# Patient Record
Sex: Male | Born: 1970 | Race: White | Hispanic: No | Marital: Married | Smoking: Never smoker
Health system: Southern US, Community
[De-identification: ages and names within clinical notes are randomized; demographics above are authoritative.]

## PROBLEM LIST (undated history)

## (undated) DIAGNOSIS — G5600 Carpal tunnel syndrome, unspecified upper limb: Secondary | ICD-10-CM

## (undated) DIAGNOSIS — G43909 Migraine, unspecified, not intractable, without status migrainosus: Secondary | ICD-10-CM

## (undated) DIAGNOSIS — I10 Essential (primary) hypertension: Secondary | ICD-10-CM

## (undated) DIAGNOSIS — K219 Gastro-esophageal reflux disease without esophagitis: Secondary | ICD-10-CM

## (undated) HISTORY — DX: Migraine, unspecified, not intractable, without status migrainosus: G43.909

## (undated) HISTORY — PX: NASAL POLYP SURGERY: SHX186

## (undated) HISTORY — DX: Essential (primary) hypertension: I10

## (undated) HISTORY — DX: Carpal tunnel syndrome, unspecified upper limb: G56.00

## (undated) HISTORY — DX: Gastro-esophageal reflux disease without esophagitis: K21.9

---

## 2018-05-11 ENCOUNTER — Encounter: Payer: Self-pay | Admitting: Neurology

## 2018-05-12 ENCOUNTER — Other Ambulatory Visit: Payer: Self-pay | Admitting: *Deleted

## 2018-05-12 DIAGNOSIS — G5603 Carpal tunnel syndrome, bilateral upper limbs: Secondary | ICD-10-CM

## 2018-05-21 ENCOUNTER — Encounter: Payer: Self-pay | Admitting: Neurology

## 2018-05-26 ENCOUNTER — Ambulatory Visit (INDEPENDENT_AMBULATORY_CARE_PROVIDER_SITE_OTHER): Payer: 59 | Admitting: Neurology

## 2018-05-26 DIAGNOSIS — G5603 Carpal tunnel syndrome, bilateral upper limbs: Secondary | ICD-10-CM | POA: Diagnosis not present

## 2018-05-26 NOTE — Procedures (Signed)
[facility]  [address], [address]  [place], [place] [address] Tel: [phone] Fax:  [phone] Test Date:  05/26/2018  Patient: Patrick RheinSteven Fristoe DOB: November 06, 1970 Physician: Nita Sickleonika Lashonne Shull, DO  Sex: Male Height: 5\' 11"  Ref Phys: Cheri RousJohn Slatosky, MD  ID#: [id number] Temp: 34.3C Technician:    Patient Complaints: This is a 47 year-old man referred for evaluation of bilateral lateral hand numbness and tingling, worse in the right.  NCV & EMG Findings: Extensive electrodiagnostic testing of the right upper extremity and additional studies of the left shows:  1. Bilateral median sensory responses show prolonged distal peak latency (R4.2, L4.0 ms) and reduced amplitude (R4.8, L12.3 V). Bilateral ulnar sensory responses are within normal limits. 2. Right median motor response shows prolonged latency (4.1 ms).  Left median and bilateral ulnar motor responses are within normal limits.  3. Chronic motor axon loss changes isolated to the right abductor pollicis brevis muscle, without need active denervation.   Impression: Bilateral median neuropathy at or distal to the wrist, consistent with clinical diagnosis of carpal tunnel syndrome. Overall, these findings are moderate in degree electrically and worse on the right.   ___________________________ Nita Sickleonika Able Malloy, DO    Nerve Conduction Studies Anti Sensory Summary Table   Site NR Peak (ms) Norm Peak (ms) P-T Amp (V) Norm P-T Amp  Left Median Anti Sensory (2nd Digit)  34.3C  Wrist    4.0 <3.4 12.3 >20  Right Median Anti Sensory (2nd Digit)  34.3C  Wrist    4.2 <3.4 4.8 >20  Left Ulnar Anti Sensory (5th Digit)  34.3C  Wrist    2.4 <3.1 29.5 >12  Right Ulnar Anti Sensory (5th Digit)  34.3C  Wrist    2.1 <3.1 35.1 >12   Motor Summary Table   Site NR Onset (ms) Norm Onset (ms) O-P Amp (mV) Norm O-P Amp Site1 Site2 Delta-0 (ms) Dist (cm) Vel (m/s) Norm Vel (m/s)  Left Median Motor (Abd Poll Brev)  34.3C  Wrist    3.5  <3.9 8.4 >6 Elbow Wrist 5.1 31.0 61 >50  Elbow    8.6  7.9         Right Median Motor (Abd Poll Brev)  34.3C  Wrist    4.1 <3.9 7.2 >6 Elbow Wrist 5.0 30.0 60 >50  Elbow    9.1  7.0         Left Ulnar Motor (Abd Dig Minimi)  34.3C  Wrist    2.1 <3.1 10.0 >7 B Elbow Wrist 3.8 25.0 66 >50  B Elbow    5.9  9.8  A Elbow B Elbow 1.7 10.0 59 >50  A Elbow    7.6  9.5         Right Ulnar Motor (Abd Dig Minimi)  34.3C  Wrist    2.0 <3.1 9.0 >7 B Elbow Wrist 3.9 24.0 62 >50  B Elbow    5.9  8.8  A Elbow B Elbow 1.6 10.0 63 >50  A Elbow    7.5  8.6          EMG   Side Muscle Ins Act Fibs Psw Fasc Number Recrt Dur Dur. Amp Amp. Poly Poly. Comment  Right 1stDorInt Nml Nml Nml Nml Nml Nml Nml Nml Nml Nml Nml Nml N/A  Right Abd Poll Brev Nml Nml Nml Nml 1- Rapid Few 1+ Few 1+ Nml Nml N/A  Right Ext Indicis Nml Nml Nml Nml Nml Nml Nml Nml Nml Nml Nml Nml  N/A  Right PronatorTeres Nml Nml Nml Nml Nml Nml Nml Nml Nml Nml Nml Nml N/A  Right Biceps Nml Nml Nml Nml Nml Nml Nml Nml Nml Nml Nml Nml N/A  Right Triceps Nml Nml Nml Nml Nml Nml Nml Nml Nml Nml Nml Nml N/A  Right Deltoid Nml Nml Nml Nml Nml Nml Nml Nml Nml Nml Nml Nml N/A  Left 1stDorInt Nml Nml Nml Nml Nml Nml Nml Nml Nml Nml Nml Nml N/A  Left Abd Poll Brev Nml Nml Nml Nml Nml Nml Nml Nml Nml Nml Nml Nml N/A  Left PronatorTeres Nml Nml Nml Nml Nml Nml Nml Nml Nml Nml Nml Nml N/A  Left Biceps Nml Nml Nml Nml Nml Nml Nml Nml Nml Nml Nml Nml N/A  Left Triceps Nml Nml Nml Nml Nml Nml Nml Nml Nml Nml Nml Nml N/A  Left Deltoid Nml Nml Nml Nml Nml Nml Nml Nml Nml Nml Nml Nml N/A      Waveforms:

## 2018-08-21 ENCOUNTER — Other Ambulatory Visit: Payer: Self-pay | Admitting: *Deleted

## 2018-08-21 ENCOUNTER — Ambulatory Visit: Payer: 59 | Admitting: Neurology

## 2018-08-21 ENCOUNTER — Encounter: Payer: Self-pay | Admitting: *Deleted

## 2018-08-21 ENCOUNTER — Encounter: Payer: Self-pay | Admitting: Neurology

## 2018-08-21 ENCOUNTER — Other Ambulatory Visit: Payer: Self-pay | Admitting: Neurology

## 2018-08-21 VITALS — BP 130/84 | HR 68 | Ht 71.0 in | Wt 256.5 lb

## 2018-08-21 DIAGNOSIS — IMO0002 Reserved for concepts with insufficient information to code with codable children: Secondary | ICD-10-CM

## 2018-08-21 DIAGNOSIS — G43709 Chronic migraine without aura, not intractable, without status migrainosus: Secondary | ICD-10-CM | POA: Diagnosis not present

## 2018-08-21 DIAGNOSIS — G4440 Drug-induced headache, not elsewhere classified, not intractable: Secondary | ICD-10-CM

## 2018-08-21 DIAGNOSIS — M542 Cervicalgia: Secondary | ICD-10-CM | POA: Diagnosis not present

## 2018-08-21 DIAGNOSIS — G5603 Carpal tunnel syndrome, bilateral upper limbs: Secondary | ICD-10-CM | POA: Diagnosis not present

## 2018-08-21 DIAGNOSIS — G43009 Migraine without aura, not intractable, without status migrainosus: Secondary | ICD-10-CM | POA: Insufficient documentation

## 2018-08-21 MED ORDER — ERENUMAB-AOOE 70 MG/ML ~~LOC~~ SOAJ: 70.0000 mg | SUBCUTANEOUS | 11 refills | Status: DC

## 2018-08-21 MED ORDER — TOPIRAMATE 50 MG PO TABS: ORAL_TABLET | ORAL | 5 refills | Status: DC

## 2018-08-21 MED ORDER — CYCLOBENZAPRINE HCL 5 MG PO TABS: 5.0000 mg | ORAL_TABLET | Freq: Every evening | ORAL | 2 refills | Status: DC | PRN

## 2018-08-21 MED ORDER — SUMATRIPTAN SUCCINATE 100 MG PO TABS: ORAL_TABLET | ORAL | 11 refills | Status: DC

## 2018-08-21 NOTE — Patient Instructions (Addendum)
Increase topiramate to 25mg  in the morning and 50mg  at bedtime x 2 weeks, then increase to 50mg  twice daily.  Start flexeril 5mg  at bedtime as needed for neck pain  Start magnesium oxide 400-600mg  daily  Start monthly Aimovig injection   Try to limit all rescue medication (imitrex, Goody's powders) to twice per week  Return to clinic in 4 months

## 2018-08-21 NOTE — Progress Notes (Signed)
[facility] Clinic Note - Initial Visit   Date: 08/21/18  Patrick Christensen MRN: [id number] DOB: 11-25-1970   Dear Dr. Guilford Shi:  Thank you for your kind referral of Patrick Christensen for consultation of migraines and CTS. Although his history is well known to you, please allow [place] to reiterate it for the purpose of our medical record. The patient was accompanied to the clinic by self.   History of Present Illness: Patrick Christensen is a 47 y.o. left-handed Caucasian male with GERD, hypertension, and migraines presenting for evaluation of worsening headaches and bilateral CTS.    He has suffered from migraines since childhood (~age 50) and was treated with medications as needed.  [facility] and was tried on depakote, wellbutrin, topiramate, amitriptyline, verapamil, venlafaxine, maxalt and zomig.  He has not tried botox.    He currently takes imitrex which is effective and topiramate 25mg  twice daily.  His migraines are left sided, usually retroorbital at at the base of the neck.  Pain is dull achy to severe pounding pain.  He has some nausea, vomiting, photophobia, and phonophobia.  He does not have vision changes, numbess/tingling, or weakness. He typically gets anywhere from 8-15 headache-days per month.  Duration is typically < 24 hours.  Rest and imitrex helps. He takes BC powder about 2-3 times per week additional to his imitrex which he takes 2-3 times per week.  His daughter also has migraines.   He also has bilateral hand paresthesias and had NCS/EMG of the arms performed here in July which showed moderate bilateral CTS.  He saw Dr. Amanda Pea and is scheduled for surgery on Tuesday for right CTS release.   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  He is here today after working 48 hour shift.  He does not sleep regularly nor get adequate rest.  He has known history of  OSA and used CPAP for several years, which did not improve his morning headaches, so stopped using it.  He has lost 80lb with diet and exercise.   Out-side paper records, electronic medical record, and images have been reviewed where available and summarized as:  NCS/EMG of the arms 05/26/2018:   Bilateral median neuropathy at or distal to the wrist, consistent with clinical diagnosis of carpal tunnel syndrome. Overall, these findings are moderate in degree electrically and worse on the right.  Past Medical History:  Diagnosis Date  . CTS (carpal tunnel syndrome)   . GERD (gastroesophageal reflux disease)   . Hypertension   . Migraine     Past Surgical History:  Procedure Laterality Date  . NASAL POLYP SURGERY       Medications:  Outpatient Encounter Medications as of 08/21/2018  Medication Sig  . amLODipine (NORVASC) 5 MG tablet   . SUMAtriptan (IMITREX) 100 MG tablet Take 1 tablet at headache onset.  If no improvement in 2 hours, ok to repeat.  . topiramate (TOPAMAX) 50 MG tablet Take 1 tablet twice daily  . [DISCONTINUED] SUMAtriptan (IMITREX) 100 MG tablet Take 100 mg by mouth See admin instructions.  . [DISCONTINUED] topiramate (TOPAMAX) 25 MG tablet   . cyclobenzaprine (FLEXERIL) 5 MG tablet Take 1 tablet (5 mg total) by mouth at bedtime as needed for muscle spasms.  Dorise Hiss (AIMOVIG) 70 MG/ML SOAJ Inject 70 mg into the skin every 30 (thirty) days.  . [DISCONTINUED] phentermine 37.5 MG capsule Take  37.5 mg by mouth every morning.   No facility-administered encounter medications on file as of 08/21/2018.      Allergies: No Known Allergies  Family History: Family History  Problem Relation Age of Onset  . GER disease Mother   . Hypertension Mother   . CAD Father   . GER disease Father   . Hypertension Father   . Hyperlipidemia Father   . Colon cancer Maternal Grandmother   . Lung cancer Maternal Grandfather   . Lung cancer Paternal Grandfather     Social  History: Social History   Tobacco Use  . Smoking status: Never Smoker  . Smokeless tobacco: Never Used  Substance Use Topics  . Alcohol use: Not Currently  . Drug use: Never   Social History   Social History Narrative   Lives with wife and 2 children in a one story home.  Works as a Radiation protection practitioner.  Education: associates degree.      Review of Systems:  CONSTITUTIONAL: No fevers, chills, night sweats, or weight loss.   EYES: No visual changes or eye pain ENT: No hearing changes.  No history of nose bleeds.   RESPIRATORY: No cough, wheezing and shortness of breath.   CARDIOVASCULAR: Negative for chest pain, and palpitations.   GI: Negative for abdominal discomfort, blood in stools or black stools.  No recent change in bowel habits.   GU:  No history of incontinence.   MUSCLOSKELETAL: No history of joint pain or swelling.  No myalgias.   SKIN: Negative for lesions, rash, and itching.   HEMATOLOGY/ONCOLOGY: Negative for prolonged bleeding, bruising easily, and swollen nodes.  No history of cancer.   ENDOCRINE: Negative for cold or heat intolerance, polydipsia or goiter.   PSYCH:  No depression or anxiety symptoms.   NEURO: As Above.   Vital Signs:  BP 130/84   Pulse 68   Ht 5\' 11"  (1.803 m)   Wt 256 lb 8 oz (116.3 kg)   SpO2 98%   BMI 35.77 kg/m    General Medical Exam:   General:  Well appearing, comfortable.   Eyes/ENT: see cranial nerve examination.   Neck: No masses appreciated.  Full range of motion without tenderness.  No carotid bruits. Respiratory:  Clear to auscultation, good air entry bilaterally.   Cardiac:  Regular rate and rhythm, no murmur.   Extremities:  No deformities, edema, or skin discoloration.  Skin:  No rashes or lesions.  Neurological Exam: MENTAL STATUS including orientation to time, place, person, recent and remote memory, attention span and concentration, language, and fund of knowledge is normal.  Speech is not dysarthric.  CRANIAL NERVES: II:   No visual field defects.  Unremarkable fundi.   III-IV-VI: Pupils equal round and reactive to light.  Normal conjugate, extra-ocular eye movements in all directions of gaze.  No nystagmus.  No ptosis.   V:  Normal facial sensation.    VII:  Normal facial symmetry and movements.  VIII:  Normal hearing and vestibular function.   IX-X:  Normal palatal movement.   XI:  Normal shoulder shrug and head rotation.   XII:  Normal tongue strength and range of motion, no deviation or fasciculation.  MOTOR:  Motor strength is 5/5 throughout.  No atrophy, fasciculations or abnormal movements.  No pronator drift.  Tone is normal.    MSRs:  Right  Left brachioradialis 2+  brachioradialis 2+  biceps 2+  biceps 2+  triceps 2+  triceps 2+  patellar 2+  patellar 2+  ankle jerk 2+  ankle jerk 2+  Hoffman no  Hoffman no  plantar response down  plantar response down   SENSORY:  Normal and symmetric perception of light touch, vibration, and proprioception.    COORDINATION/GAIT: Normal finger-to- nose-finger.  Intact rapid alternating movements bilaterally.  Tandem and stressed gait intact.    IMPRESSION: 1.  Chronic migraine  - Lifestyle modification was stressed as he is getting very little sleep, working 80-120hr/week) and not using CPAP with OSA   - Previously tried: depakote, wellbutrin, topiramate, amitriptyline, verapamil, venlafaxine  - Increase topiramate to 50mg  twice daily  - Start PA for Aimovig 70mg  monthly - first injection given today  - Start magnesium 400-600mg  daily  2.  Episodic migraine without aura  - Previously tried:  Maxalt, zomig  - Continue imitrex 100mg  for severe migraine, try to limit to twice per week 3.  Medication overuse headaches  - Counseled at length about rebound headaches due to too much rescue medication  - Limit all rescue therapy to twice per week 4.  Cervicalgia  - Start flexeril 5mg  at bedtime as  needed 5.  Bilateral CTS, worse on the right, followed by Dr. Amanda Pea with upcoming surgery next week  Return to clinic in 4 months.  Thank you for allowing me to participate in patient's care.  If I can answer any additional questions, I would be pleased to do so.    Sincerely,    Ailana Cuadrado K. Allena Katz, DO

## 2019-01-07 NOTE — Progress Notes (Signed)
Follow-up Visit   Date: 01/08/19   OLUKAYODE NUZUM MRN: [id number] DOB: 09/24/1971   Interim History: Patrick Christensen is a 48 y.o. left-handed Caucasian male with GERD, hypertension, and migraines returning to the clinic for follow-up of headaches.  The patient was accompanied to the clinic by self.  History of present illness: He has suffered from migraines since childhood (~age 48) and was treated with medications as needed.  [facility] and was tried on depakote, wellbutrin, topiramate, amitriptyline, verapamil, venlafaxine, maxalt and zomig.  He has not tried botox.    He takes imitrex which is effective and topiramate 25mg  twice daily.  His migraines are left sided, usually retroorbital at at the base of the neck.  Pain is dull achy to severe pounding pain.  He has some nausea, vomiting, photophobia, and phonophobia.  He does not have vision changes, numbess/tingling, or weakness. He typically gets anywhere from 8-15 headache-days per month.  Duration is typically < 24 hours.  Rest and imitrex helps. He takes BC powder about 2-3 times per week additional to his imitrex which he takes 2-3 times per week.  His daughter also has migraines.   He also has bilateral hand paresthesias and had NCS/EMG of the arms performed here in July which showed moderate bilateral CTS.    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  He is here today after working 48 hour shift.  He does not sleep regularly nor get adequate rest.  He has known history of OSA and used CPAP for several years, which did not improve his morning headaches, so stopped using it.  He has lost 80lb with diet and exercise.   UPDATE 01/08/2019:  He continues to take topiramate 25mg  and Aimovig monthly injections. He is concerned about the cost of Aimovig once the promotional year is up.   He has noticed mild improvement, but continues to have  headaches about 8-10 times per month, which continue to respond to imitrex and NSAIDs.  He had right CTS release and is doing well.   Medications:  Current Outpatient Medications on File Prior to Visit  Medication Sig Dispense Refill  . AIMOVIG 70 MG/ML SOAJ INJECT 70 MG INTO THE SKIN EVERY 30 (THIRTY) DAYS. 1 pen 11  . amLODipine (NORVASC) 5 MG tablet     . cyclobenzaprine (FLEXERIL) 5 MG tablet Take 1 tablet (5 mg total) by mouth at bedtime as needed for muscle spasms. 30 tablet 2  . SUMAtriptan (IMITREX) 100 MG tablet Take 1 tablet at headache onset.  If no improvement in 2 hours, ok to repeat. 12 tablet 11  . topiramate (TOPAMAX) 50 MG tablet Take 1 tablet twice daily 60 tablet 5   No current facility-administered medications on file prior to visit.     Allergies: No Known Allergies  Review of Systems:  CONSTITUTIONAL: No fevers, chills, night sweats, or weight loss.  EYES: No visual changes or eye pain ENT: No hearing changes.  No history of nose bleeds.   RESPIRATORY: No cough, wheezing and shortness of breath.   CARDIOVASCULAR: Negative for chest pain, and palpitations.   GI: Negative for abdominal discomfort, blood in stools or black stools.  No recent change in bowel habits.   GU:  No history of incontinence.   MUSCLOSKELETAL: No history of joint pain or swelling.  No myalgias.   SKIN: Negative for lesions, rash, and  itching.   ENDOCRINE: Negative for cold or heat intolerance, polydipsia or goiter.   PSYCH:  No depression or anxiety symptoms.   NEURO: As Above.   Vital Signs:  BP 118/88   Pulse 78   Temp 97.6 F (36.4 C)   Ht 5\' 11"  (1.803 m)   Wt 300 lb (136.1 kg)   SpO2 98%   BMI 41.84 kg/m    General Medical Exam:   General:  Well appearing, comfortable  Eyes/ENT: see cranial nerve examination.   Neck:  No carotid bruits. Respiratory:  Clear to auscultation, good air entry bilaterally.   Cardiac:  Regular rate and rhythm, no murmur.   Ext:  No edema    Neurological Exam: MENTAL STATUS including orientation to time, place, person, recent and remote memory, attention span and concentration, language, and fund of knowledge is normal.  Speech is not dysarthric.  CRANIAL NERVES:  No visual field defects.  Pupils equal round and reactive to light.  Normal conjugate, extra-ocular eye movements in all directions of gaze.  No ptosis.  Face is symmetric. Palate elevates symmetrically.  Tongue is midline.  MOTOR:  Motor strength is 5/5 in all extremities  COORDINATION/GAIT:  Normal finger-to- nose-finger.  Intact rapid alternating movements bilaterally.  Gait narrow based and stable.   Data:n/a  IMPRESSION/PLAN: 1.  Chronic migraine, partly contributed by poor sleep hygeinne and and not using CPAP with OSA              - Previously tried: depakote, wellbutrin, topiramate, amitriptyline, verapamil, venlafaxine              - Increase topiramate to 50mg  twice daily              - Continue Aimovig 70mg  monthly   2.  Episodic migraine without aura              - Previously tried:  Maxalt, zomig              - He gets adequate relief with imitrex 100mg  for severe migraine, try to limit to twice per week  3.  Medication overuse headaches, imitrex 8-10 times per month in addition to NSAIDs              - Counseled at length about rebound headaches due to too much rescue medication              - Limit all rescue therapy to twice per week  4.  Cervicalgia              - Continue flexeril 5mg  at bedtime as needed  5.  Bilateral CTS, worse on the right, s/p right CTS release  Return to clinic in 6 months.    Thank you for allowing me to participate in patient's care.  If I can answer any additional questions, I would be pleased to do so.    Sincerely,    Donika K. Allena Katz, DO

## 2019-01-08 ENCOUNTER — Ambulatory Visit (INDEPENDENT_AMBULATORY_CARE_PROVIDER_SITE_OTHER): Payer: 59 | Admitting: Neurology

## 2019-01-08 ENCOUNTER — Other Ambulatory Visit: Payer: Self-pay

## 2019-01-08 ENCOUNTER — Encounter: Payer: Self-pay | Admitting: Neurology

## 2019-01-08 VITALS — BP 118/88 | HR 78 | Temp 97.6°F | Ht 71.0 in | Wt 300.0 lb

## 2019-01-08 DIAGNOSIS — G5603 Carpal tunnel syndrome, bilateral upper limbs: Secondary | ICD-10-CM | POA: Diagnosis not present

## 2019-01-08 DIAGNOSIS — G4440 Drug-induced headache, not elsewhere classified, not intractable: Secondary | ICD-10-CM

## 2019-01-08 DIAGNOSIS — G43709 Chronic migraine without aura, not intractable, without status migrainosus: Secondary | ICD-10-CM | POA: Diagnosis not present

## 2019-01-08 DIAGNOSIS — M542 Cervicalgia: Secondary | ICD-10-CM | POA: Diagnosis not present

## 2019-01-08 DIAGNOSIS — IMO0002 Reserved for concepts with insufficient information to code with codable children: Secondary | ICD-10-CM

## 2019-01-08 MED ORDER — TOPIRAMATE 50 MG PO TABS: 50.0000 mg | ORAL_TABLET | Freq: Two times a day (BID) | ORAL | 3 refills | Status: DC

## 2019-01-08 NOTE — Patient Instructions (Signed)
Increase topiramate 50mg  daily  Continue monthly Aimovig injections  Return to clinic in 6 months

## 2019-06-21 ENCOUNTER — Ambulatory Visit (INDEPENDENT_AMBULATORY_CARE_PROVIDER_SITE_OTHER): Payer: 59 | Admitting: Cardiology

## 2019-06-21 ENCOUNTER — Other Ambulatory Visit: Payer: Self-pay

## 2019-06-21 ENCOUNTER — Encounter: Payer: Self-pay | Admitting: Cardiology

## 2019-06-21 DIAGNOSIS — R0609 Other forms of dyspnea: Secondary | ICD-10-CM

## 2019-06-21 DIAGNOSIS — G4733 Obstructive sleep apnea (adult) (pediatric): Secondary | ICD-10-CM | POA: Diagnosis not present

## 2019-06-21 DIAGNOSIS — R072 Precordial pain: Secondary | ICD-10-CM | POA: Insufficient documentation

## 2019-06-21 MED ORDER — NITROGLYCERIN 0.4 MG SL SUBL: 0.4000 mg | SUBLINGUAL_TABLET | SUBLINGUAL | 11 refills | Status: DC | PRN

## 2019-06-21 MED ORDER — METOPROLOL SUCCINATE ER 25 MG PO TB24: 25.0000 mg | ORAL_TABLET | Freq: Every day | ORAL | 1 refills | Status: DC

## 2019-06-21 NOTE — Progress Notes (Signed)
Cardiology Consultation:    Date:  06/21/2019   ID:  Patrick RompSteven D Peak, DOB 13-Nov-1970, MRN [id number]  PCP:  Nonnie DoneSlatosky, John J., MD  Cardiologist:  Gypsy Balsamobert Laramie Meissner, MD   Referring MD: Nonnie DoneSlatosky, John J., MD   Chief Complaint  Patient presents with  . Shortness of Breath  . Chest Pain    History of Present Illness:    Patrick Christensen is a 48 y.o. male who is being seen today for the evaluation of chest pain and shortness of breath at the request of Slatosky, Excell SeltzerJohn J., MD.  He is an Special educational needs teacherMS technician for last few months he noticed gradual increased shortness of breath with exertion as well as some chest pain.  He is morbidly obese and he struggled with weight all his life multiple times he was able to lose 100 pounds and then gained it all back.  Recently she is gaining weight as well.  She is thinks that maybe he shortness of breath and chest pain is related to weight gain as well as deconditioning or may be simply getting all.  Chest pain he described as tightness that happens sometimes when he walks.  Typically relieved by rest. He does not have the money risk factors for coronary artery disease.  He does not smoke does not have dyslipidemia he said, no diabetes.  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  Past Medical History:  Diagnosis Date  . CTS (carpal tunnel syndrome)   . GERD (gastroesophageal reflux disease)   . Hypertension   . Migraine     Past Surgical History:  Procedure Laterality Date  . CARPAL TUNNEL RELEASE    . MANDIBLE SURGERY    . NASAL POLYP SURGERY    . SKIN GRAFT SPLIT THICKNESS LEG / FOOT      Current Medications: Current Meds  Medication Sig  . AIMOVIG 70 MG/ML SOAJ INJECT 70 MG INTO THE SKIN EVERY 30 (THIRTY) DAYS.  Marland Kitchen. amLODipine (NORVASC) 5 MG tablet Take 5 mg by mouth daily.   . cyclobenzaprine (FLEXERIL)  5 MG tablet Take 1 tablet (5 mg total) by mouth at bedtime as needed for muscle spasms.  . SUMAtriptan (IMITREX) 100 MG tablet Take 1 tablet at headache onset.  If no improvement in 2 hours, ok to repeat.  . topiramate (TOPAMAX) 50 MG tablet Take 1 tablet (50 mg total) by mouth 2 (two) times daily.     Allergies:   Patient has no known allergies.   Social History   Socioeconomic History  . Marital status: Married    Spouse name: Not on file  . Number of children: 2  . Years of education: 4314  . Highest education level: Associate degree: occupational, Scientist, product/process developmenttechnical, or vocational program  Occupational History  . Occupation: paramedic    [place] [employer]  Social Needs  . Financial resource strain: Not on file  . Food insecurity    Worry: Not on file    Inability: Not on file  . Transportation needs    Medical: Not on file    Non-medical: Not on file  Tobacco Use  . Smoking status: Never Smoker  . Smokeless tobacco: Never Used  Substance and Sexual Activity  . Alcohol use: Yes    Comment: occasional  . Drug use: Never  . Sexual activity: Not on file  Lifestyle  .  Physical activity    Days per week: Not on file    Minutes per session: Not on file  . Stress: Not on file  Relationships  . Social Musicianconnections    Talks on phone: Not on file    Gets together: Not on file    Attends religious service: Not on file    Active member of club or organization: Not on file    Attends meetings of clubs or organizations: Not on file    Relationship status: Not on file  Other Topics Concern  . Not on file  Social History Narrative   Lives with wife and 2 children in a one story home.  Works as a Radiation protection practitionerparamedic.  Education: associates degree.       Family History: The patient's family history includes CAD in his father; Colon cancer in his maternal grandmother; GER disease in his father and mother; Hyperlipidemia in his father; Hypertension in his father and mother; Lung cancer in his  maternal grandfather and paternal grandfather. ROS:   Please see the history of present illness.    All 14 point review of systems negative except as described per history of present illness.  EKGs/Labs/Other Studies Reviewed:    The following studies were reviewed today:   EKG:  EKG is  ordered today.  The ekg ordered today demonstrates sinus tachycardia rate 114, normal P interval, normal QS complex duration morphology no ST-T segment changes  Recent Labs: No results found for requested labs within last 8760 hours.  Recent Lipid Panel No results found for: CHOL, TRIG, HDL, CHOLHDL, VLDL, LDLCALC, LDLDIRECT  Physical Exam:    VS:  BP 120/64   Pulse (!) 115   Ht 5\' 11"  (1.803 m)   Wt (!) 305 lb 6.4 oz (138.5 kg)   SpO2 97%   BMI 42.59 kg/m     Wt Readings from Last 3 Encounters:  06/21/19 (!) 305 lb 6.4 oz (138.5 kg)  01/08/19 300 lb (136.1 kg)  08/21/18 256 lb 8 oz (116.3 kg)     GEN:  Well nourished, well developed in no acute distress HEENT: Normal NECK: No JVD; No carotid bruits LYMPHATICS: No lymphadenopathy CARDIAC: RRR, no murmurs, no rubs, no gallops RESPIRATORY:  Clear to auscultation without rales, wheezing or rhonchi  ABDOMEN: Soft, non-tender, non-distended MUSCULOSKELETAL:  No edema; No deformity  SKIN: Warm and dry NEUROLOGIC:  Alert and oriented x 3 PSYCHIATRIC:  Normal affect   ASSESSMENT:    1. Dyspnea on exertion   2. Precordial chest pain   3. Obstructive sleep apnea   4. Morbid obesity (HCC)    PLAN:    In order of problems listed above:  1. Dyspnea on exertion.  Probably multifactorial obesity clearly, plays some role here.  I will ask him to have echocardiogram.  Since he does have a sleep apnea that is not well managed medically where he may have pulmonary hypertension.  He also reports to have some swelling of lower extremities sometimes with could be 1 of the signs and symptoms of pulmonary hypertension.  I will not start any medication  for this problem right now until we have more clarification of the diagnosis. 2. Precordial chest pain with some worrisome characteristics.  Luckily he does not have too many risk factors for coronary artery disease but his symptoms are concerning we will talk about options for this situation.  For now I asked him to start taking one baby aspirin every single day, will put him  on beta-blocker which will be Toprol-XL 25 mg daily, I gave him nitroglycerin as needed.  We will schedule him to have a calcium score in flexion and flow reserve. 3. Obstructive sleep apnea.  We will check him to see if he got any significant pulmonary hypertension.  We started talking about potential referral to sleep clinic he want to think about it. 4. Morbid obesity obviously a problem.  He understands and initially he is trying to work on it.  So far he succeeded few times in his life being able to lose up to 100 pounds.  I hope he will be able to succeed again.   Medication Adjustments/Labs and Tests Ordered: Current medicines are reviewed at length with the patient today.  Concerns regarding medicines are outlined above.  No orders of the defined types were placed in this encounter.  No orders of the defined types were placed in this encounter.   Signed, Park Liter, MD, [facility]. 06/21/2019 3:30 PM    [place]

## 2019-06-21 NOTE — Patient Instructions (Addendum)
Medication Instructions:  Your physician has recommended you make the following change in your medication:   Start: Metoprolol succinate 25 mg daily   Take as needed for chest pain: Nitroglycerin 0.4 mg sublingual (under your tongue) as needed for chest pain. If experiencing chest pain, stop what you are doing and sit down. Take 1 nitroglycerin and wait 5 minutes. If chest pain continues, take another nitroglycerin and wait 5 minutes. If chest pain does not subside, take 1 more nitroglycerin and dial 911. You make take a total of 3 nitroglycerin in a 15 minute time frame.    If you need a refill on your cardiac medications before your next appointment, please call your pharmacy.   Lab work: None.  If you have labs (blood work) drawn today and your tests are completely normal, you will receive your results only by:  MyChart Message (if you have MyChart) OR  A paper copy in the mail If you have any lab test that is abnormal or we need to change your treatment, we will call you to review the results.  Testing/Procedures: Your physician has requested that you have an echocardiogram. Echocardiography is a painless test that uses sound waves to create images of your heart. It provides your doctor with information about the size and shape of your heart and how well your hearts chambers and valves are working. This procedure takes approximately one hour. There are no restrictions for this procedure.  Non-Cardiac CT scanning, (CAT scanning), is a noninvasive, special x-ray that produces cross-sectional images of the body using x-rays and a computer. CT scans help physicians diagnose and treat medical conditions. For some CT exams, a contrast material is used to enhance visibility in the area of the body being studied. CT scans provide greater clarity and reveal more details than regular x-ray exams.    Follow-Up: At [facility], you and your health needs are our priority.  As part of our  continuing mission to provide you with exceptional heart care, we have created designated Provider Care Teams.  These Care Teams include your primary Cardiologist (physician) and Advanced Practice Providers (APPs -  Physician Assistants and Nurse Practitioners) who all work together to provide you with the care you need, when you need it. You will need a follow up appointment in 1 months.  Please call our office 2 months in advance to schedule this appointment.  You may see No primary care provider on file. or another member of our [facility] Provider Team in [place]: Norman HerrlichBrian Munley, MD  Belva Cromeajan Revankar, MD  Any Other Special Instructions Will Be Listed Below (If Applicable).   Echocardiogram An echocardiogram is a procedure that uses painless sound waves (ultrasound) to produce an image of the heart. Images from an echocardiogram can provide important information about:  Signs of coronary artery disease (CAD).  Aneurysm detection. An aneurysm is a weak or damaged part of an artery wall that bulges out from the normal force of blood pumping through the body.  Heart size and shape. Changes in the size or shape of the heart can be associated with certain conditions, including heart failure, aneurysm, and CAD.  Heart muscle function.  Heart valve function.  Signs of a past heart attack.  Fluid buildup around the heart.  Thickening of the heart muscle.  A tumor or infectious growth around the heart valves. Tell a health care provider about:  Any allergies you have.  All medicines you are taking, including vitamins, herbs, eye  drops, creams, and over-the-counter medicines.  Any blood disorders you have.  Any surgeries you have had.  Any medical conditions you have.  Whether you are pregnant or may be pregnant. What are the risks? Generally, this is a safe procedure. However, problems may occur, including:  Allergic reaction to dye (contrast) that may be used during the  procedure. What happens before the procedure? No specific preparation is needed. You may eat and drink normally. What happens during the procedure?   An IV tube may be inserted into one of your veins.  You may receive contrast through this tube. A contrast is an injection that improves the quality of the pictures from your heart.  A gel will be applied to your chest.  A wand-like tool (transducer) will be moved over your chest. The gel will help to transmit the sound waves from the transducer.  The sound waves will harmlessly bounce off of your heart to allow the heart images to be captured in real-time motion. The images will be recorded on a computer. The procedure may vary among health care providers and hospitals. What happens after the procedure?  You may return to your normal, everyday life, including diet, activities, and medicines, unless your health care provider tells you not to do that. Summary  An echocardiogram is a procedure that uses painless sound waves (ultrasound) to produce an image of the heart.  Images from an echocardiogram can provide important information about the size and shape of your heart, heart muscle function, heart valve function, and fluid buildup around your heart.  You do not need to do anything to prepare before this procedure. You may eat and drink normally.  After the echocardiogram is completed, you may return to your normal, everyday life, unless your health care provider tells you not to do that. This information is not intended to replace advice given to you by your health care provider. Make sure you discuss any questions you have with your health care provider. Document Released: 10/11/2000 Document Revised: 02/04/2019 Document Reviewed: 11/16/2016 Elsevier Patient Education  2020 Elsevier Inc.   Metoprolol extended-release tablets What is this medicine? METOPROLOL (me TOE proe lole) is a beta-blocker. Beta-blockers reduce the workload  on the heart and help it to beat more regularly. This medicine is used to treat high blood pressure and to prevent chest pain. It is also used to after a heart attack and to prevent an additional heart attack from occurring. This medicine may be used for other purposes; ask your health care provider or pharmacist if you have questions. COMMON BRAND NAME(S): toprol, Toprol XL What should I tell my health care provider before I take this medicine? They need to know if you have any of these conditions:  diabetes  heart or vessel disease like slow heart rate, worsening heart failure, heart block, sick sinus syndrome or Raynaud's disease  kidney disease  liver disease  lung or breathing disease, like asthma or emphysema  pheochromocytoma  thyroid disease  an unusual or allergic reaction to metoprolol, other beta-blockers, medicines, foods, dyes, or preservatives  pregnant or trying to get pregnant  breast-feeding How should I use this medicine? Take this medicine by mouth with a glass of water. Follow the directions on the prescription label. Do not crush or chew. Take this medicine with or immediately after meals. Take your doses at regular intervals. Do not take more medicine than directed. Do not stop taking this medicine suddenly. This could lead to serious heart-related effects.  Talk to your pediatrician regarding the use of this medicine in children. While this drug may be prescribed for children as young as 6 years for selected conditions, precautions do apply. Overdosage: If you think you have taken too much of this medicine contact a poison control center or emergency room at once. NOTE: This medicine is only for you. Do not share this medicine with others. What if I miss a dose? If you miss a dose, take it as soon as you can. If it is almost time for your next dose, take only that dose. Do not take double or extra doses. What may interact with this medicine? This medicine may  interact with the following medications:  certain medicines for blood pressure, heart disease, irregular heart beat  certain medicines for depression, like monoamine oxidase (MAO) inhibitors, fluoxetine, or paroxetine  clonidine  dobutamine  epinephrine  isoproterenol  reserpine This list may not describe all possible interactions. Give your health care provider a list of all the medicines, herbs, non-prescription drugs, or dietary supplements you use. Also tell them if you smoke, drink alcohol, or use illegal drugs. Some items may interact with your medicine. What should I watch for while using this medicine? Visit your doctor or health care professional for regular check ups. Contact your doctor right away if your symptoms worsen. Check your blood pressure and pulse rate regularly. Ask your health care professional what your blood pressure and pulse rate should be, and when you should contact them. You may get drowsy or dizzy. Do not drive, use machinery, or do anything that needs mental alertness until you know how this medicine affects you. Do not sit or stand up quickly, especially if you are an older patient. This reduces the risk of dizzy or fainting spells. Contact your doctor if these symptoms continue. Alcohol may interfere with the effect of this medicine. Avoid alcoholic drinks. This medicine may increase blood sugar. Ask your healthcare provider if changes in diet or medicines are needed if you have diabetes. What side effects may I notice from receiving this medicine? Side effects that you should report to your doctor or health care professional as soon as possible:  allergic reactions like skin rash, itching or hives  cold or numb hands or feet  depression  difficulty breathing  faint  fever with sore throat  irregular heartbeat, chest pain  rapid weight gain   signs and symptoms of high blood sugar such as being more thirsty or hungry or having to urinate more  than normal. You may also feel very tired or have blurry vision.  swollen legs or ankles Side effects that usually do not require medical attention (report to your doctor or health care professional if they continue or are bothersome):  anxiety or nervousness  change in sex drive or performance  dry skin  headache  nightmares or trouble sleeping  short term memory loss  stomach upset or diarrhea This list may not describe all possible side effects. Call your doctor for medical advice about side effects. You may report side effects to FDA at 1-800-FDA-1088. Where should I keep my medicine? Keep out of the reach of children. Store at room temperature between 15 and 30 degrees C (59 and 86 degrees F). Throw away any unused medicine after the expiration date. NOTE: This sheet is a summary. It may not cover all possible information. If you have questions about this medicine, talk to your doctor, pharmacist, or health care provider.  2020 Elsevier/Gold  Standard (2018-08-04 11:09:41)   Coronary Calcium Scan A coronary calcium scan is an imaging test used to look for deposits of calcium and other fatty materials (plaques) in the inner lining of the blood vessels of the heart (coronary arteries). These deposits of calcium and plaques can partly clog and narrow the coronary arteries without producing any symptoms or warning signs. This puts a person at risk for a heart attack. This test can detect these deposits before symptoms develop. Tell a health care provider about:  Any allergies you have.  All medicines you are taking, including vitamins, herbs, eye drops, creams, and over-the-counter medicines.  Any problems you or family members have had with anesthetic medicines.  Any blood disorders you have.  Any surgeries you have had.  Any medical conditions you have.  Whether you are pregnant or may be pregnant. What are the risks? Generally, this is a safe procedure. However,  problems may occur, including:  Harm to a pregnant woman and her unborn baby. This test involves the use of radiation. Radiation exposure can be dangerous to a pregnant woman and her unborn baby. If you are pregnant, you generally should not have this procedure done.  Slight increase in the risk of cancer. This is because of the radiation involved in the test. What happens before the procedure? No preparation is needed for this procedure. What happens during the procedure?   You will undress and remove any jewelry around your neck or chest.  You will put on a hospital gown.  Sticky electrodes will be placed on your chest. The electrodes will be connected to an electrocardiogram (ECG) machine to record a tracing of the electrical activity of your heart.  A CT scanner will take pictures of your heart. During this time, you will be asked to lie still and hold your breath for 2-3 seconds while a picture of your heart is being taken. The procedure may vary among health care providers and hospitals. What happens after the procedure?  You can get dressed.  You can return to your normal activities.  It is up to you to get the results of your test. Ask your health care provider, or the department that is doing the test, when your results will be ready. Summary  A coronary calcium scan is an imaging test used to look for deposits of calcium and other fatty materials (plaques) in the inner lining of the blood vessels of the heart (coronary arteries).  Generally, this is a safe procedure. Tell your health care provider if you are pregnant or may be pregnant.  No preparation is needed for this procedure.  A CT scanner will take pictures of your heart.  You can return to your normal activities after the scan is done. This information is not intended to replace advice given to you by your health care provider. Make sure you discuss any questions you have with your health care provider. Document  Released: 04/11/2008 Document Revised: 09/26/2017 Document Reviewed: 09/02/2016 Elsevier Patient Education  2020 Elsevier Inc.  Nitroglycerin sublingual tablets What is this medicine? NITROGLYCERIN (nye troe GLI ser in) is a type of vasodilator. It relaxes blood vessels, increasing the blood and oxygen supply to your heart. This medicine is used to relieve chest pain caused by angina. It is also used to prevent chest pain before activities like climbing stairs, going outdoors in cold weather, or sexual activity. This medicine may be used for other purposes; ask your health care provider or pharmacist if you  have questions. COMMON BRAND NAME(S): Nitroquick, Nitrostat, Nitrotab What should I tell my health care provider before I take this medicine? They need to know if you have any of these conditions:  anemia  head injury, recent stroke, or bleeding in the brain  liver disease  previous heart attack  an unusual or allergic reaction to nitroglycerin, other medicines, foods, dyes, or preservatives  pregnant or trying to get pregnant  breast-feeding How should I use this medicine? Take this medicine by mouth as needed. At the first sign of an angina attack (chest pain or tightness) place one tablet under your tongue. You can also take this medicine 5 to 10 minutes before an event likely to produce chest pain. Follow the directions on the prescription label. Let the tablet dissolve under the tongue. Do not swallow whole. Replace the dose if you accidentally swallow it. It will help if your mouth is not dry. Saliva around the tablet will help it to dissolve more quickly. Do not eat or drink, smoke or chew tobacco while a tablet is dissolving. If you are not better within 5 minutes after taking ONE dose of nitroglycerin, call 9-1-1 immediately to seek emergency medical care. Do not take more than 3 nitroglycerin tablets over 15 minutes. If you take this medicine often to relieve symptoms of  angina, your doctor or health care professional may provide you with different instructions to manage your symptoms. If symptoms do not go away after following these instructions, it is important to call 9-1-1 immediately. Do not take more than 3 nitroglycerin tablets over 15 minutes. Talk to your pediatrician regarding the use of this medicine in children. Special care may be needed. Overdosage: If you think you have taken too much of this medicine contact a poison control center or emergency room at once. NOTE: This medicine is only for you. Do not share this medicine with others. What if I miss a dose? This does not apply. This medicine is only used as needed. What may interact with this medicine? Do not take this medicine with any of the following medications:  certain migraine medicines like ergotamine and dihydroergotamine (DHE)  medicines used to treat erectile dysfunction like sildenafil, tadalafil, and vardenafil  riociguat This medicine may also interact with the following medications:  alteplase  aspirin  heparin  medicines for high blood pressure  medicines for mental depression  other medicines used to treat angina  phenothiazines like chlorpromazine, mesoridazine, prochlorperazine, thioridazine This list may not describe all possible interactions. Give your health care provider a list of all the medicines, herbs, non-prescription drugs, or dietary supplements you use. Also tell them if you smoke, drink alcohol, or use illegal drugs. Some items may interact with your medicine. What should I watch for while using this medicine? Tell your doctor or health care professional if you feel your medicine is no longer working. Keep this medicine with you at all times. Sit or lie down when you take your medicine to prevent falling if you feel dizzy or faint after using it. Try to remain calm. This will help you to feel better faster. If you feel dizzy, take several deep breaths and  lie down with your feet propped up, or bend forward with your head resting between your knees. You may get drowsy or dizzy. Do not drive, use machinery, or do anything that needs mental alertness until you know how this drug affects you. Do not stand or sit up quickly, especially if you are an older  patient. This reduces the risk of dizzy or fainting spells. Alcohol can make you more drowsy and dizzy. Avoid alcoholic drinks. Do not treat yourself for coughs, colds, or pain while you are taking this medicine without asking your doctor or health care professional for advice. Some ingredients may increase your blood pressure. What side effects may I notice from receiving this medicine? Side effects that you should report to your doctor or health care professional as soon as possible:  blurred vision  dry mouth  skin rash  sweating  the feeling of extreme pressure in the head  unusually weak or tired Side effects that usually do not require medical attention (report to your doctor or health care professional if they continue or are bothersome):  flushing of the face or neck  headache  irregular heartbeat, palpitations  nausea, vomiting This list may not describe all possible side effects. Call your doctor for medical advice about side effects. You may report side effects to FDA at 1-800-FDA-1088. Where should I keep my medicine? Keep out of the reach of children. Store at room temperature between 20 and 25 degrees C (68 and 77 degrees F). Store in [facility] [employer]. Protect from light and moisture. Keep tightly closed. Throw away any unused medicine after the expiration date. NOTE: This sheet is a summary. It may not cover all possible information. If you have questions about this medicine, talk to your doctor, pharmacist, or health care provider.  2020 Elsevier/Gold Standard (2013-08-12 17:57:36)

## 2019-06-21 NOTE — Addendum Note (Signed)
Addended by: Ashok Norris on: 06/21/2019 03:43 PM   Modules accepted: Orders

## 2019-06-24 ENCOUNTER — Ambulatory Visit (HOSPITAL_BASED_OUTPATIENT_CLINIC_OR_DEPARTMENT_OTHER)
Admission: RE | Admit: 2019-06-24 | Discharge: 2019-06-24 | Disposition: A | Discharge status: Home / Self Care | Payer: Managed Care, Other (non HMO) | Source: Ambulatory Visit | Attending: Cardiology | Admitting: Cardiology

## 2019-06-24 ENCOUNTER — Other Ambulatory Visit: Payer: Self-pay

## 2019-06-24 DIAGNOSIS — R072 Precordial pain: Secondary | ICD-10-CM | POA: Insufficient documentation

## 2019-06-24 DIAGNOSIS — R0609 Other forms of dyspnea: Secondary | ICD-10-CM | POA: Diagnosis not present

## 2019-06-24 NOTE — Progress Notes (Signed)
  Echocardiogram 2D Echocardiogram has been performed.  Cardell Peach 06/24/2019, 8:59 AM

## 2019-07-02 ENCOUNTER — Ambulatory Visit: Payer: 59 | Admitting: Cardiology

## 2019-07-16 ENCOUNTER — Ambulatory Visit: Payer: 59 | Admitting: Cardiology

## 2019-07-20 ENCOUNTER — Encounter: Payer: Self-pay | Admitting: Neurology

## 2019-07-21 ENCOUNTER — Other Ambulatory Visit: Payer: Self-pay

## 2019-07-21 ENCOUNTER — Telehealth (INDEPENDENT_AMBULATORY_CARE_PROVIDER_SITE_OTHER): Payer: Managed Care, Other (non HMO) | Admitting: Neurology

## 2019-07-21 VITALS — Ht 71.0 in | Wt 300.0 lb

## 2019-07-21 DIAGNOSIS — G43709 Chronic migraine without aura, not intractable, without status migrainosus: Secondary | ICD-10-CM | POA: Diagnosis not present

## 2019-07-21 DIAGNOSIS — M542 Cervicalgia: Secondary | ICD-10-CM

## 2019-07-21 DIAGNOSIS — IMO0002 Reserved for concepts with insufficient information to code with codable children: Secondary | ICD-10-CM

## 2019-07-21 MED ORDER — SUMATRIPTAN SUCCINATE 100 MG PO TABS: ORAL_TABLET | ORAL | 11 refills | Status: DC

## 2019-07-21 MED ORDER — TOPIRAMATE 50 MG PO TABS: 75.0000 mg | ORAL_TABLET | Freq: Two times a day (BID) | ORAL | 3 refills | Status: DC

## 2019-07-21 NOTE — Progress Notes (Signed)
   Virtual Visit via Video Note The purpose of this virtual visit is to provide medical care while limiting exposure to the novel coronavirus.    Consent was obtained for video visit:  Yes.   Answered questions that patient had about telehealth interaction:  Yes.   I discussed the limitations, risks, security and privacy concerns of performing an evaluation and management service by telemedicine. I also discussed with the patient that there may be a patient responsible charge related to this service. The patient expressed understanding and agreed to proceed.  Pt location: Home Physician Location: office Name of referring provider:  Enid Skeens., MD I connected with Milus Banister at patients initiation/request on 07/21/2019 at  8:50 AM EDT by video enabled telemedicine application and verified that I am speaking with the correct person using two identifiers. Pt MRN:  [id number] Pt DOB:  [birth year] Video Participants:  Milus Banister   History of Present Illness: This is a 48 y.o. male returning for follow-up of migraines.  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  He also works 24-hour shifts at a part-time job and has reduced this to once per week.  His sleep is doing somewhat better.  He continues to have migraines, but feels that the frequency has improved by 20%.  At his last visit, I increased topiramate to 50 mg twice daily.  He is tolerating this well without any side effects.  He is also using less Imitrex per month, about 6 tablets, down from 8-10.  He no longer treats his headaches with NSAIDs, however he does use this for orthopedic pain.  He does not feel that Aimovig is helping a significant amount, as he has often stretched it to 6 weeks before his next injection and did not appreciate any marked change.   Observations/Objective:   Vitals:   07/20/19 0930  Weight: 300 lb (136.1 kg)  Height: 5\' 11"  (1.803 m)   Patient is awake, alert, and appears comfortable.   Oriented x 4.   Extraocular muscles are intact. No ptosis.  Face is symmetric.  Speech is not dysarthric. Tongue is midline. Antigravity in all extremities.  No pronator drift.   Assessment and Plan:  1.  Chronic migraine, contributed by poor sleep and noncompliance with CPAP for OSA  -Previously tried: Depakote, Wellbutrin, topiramate, amitriptyline, verapamil, venlafaxine  -Increase topiramate to 75mg  twice daily - titrate over 1 month.  Side effects were discussed  -If he develops side effects to topiramate, I will transition him to extended release  -Discontinue Aimovig due to no benefit  2.  Episodic migraine without aura  -Previously tried: Maxalt, Zomig  -Continue Imitrex 100 mg for severe migraine, which is effective.  Limit to twice per week  3.  Cervicalgia  -Continue Flexeril 5 mg at bedtime as needed  4.  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 The patient was provided an opportunity to ask questions and all were answered. The patient agreed with the plan and demonstrated an understanding of the instructions.   The patient was advised to call back or seek an in-person evaluation if the symptoms worsen or if the condition fails to improve as anticipated.  Follow-up in 6 months  Total time spent:  20 minutes     Alda Berthold, DO

## 2019-08-05 ENCOUNTER — Other Ambulatory Visit: Payer: Self-pay

## 2019-08-05 ENCOUNTER — Ambulatory Visit (INDEPENDENT_AMBULATORY_CARE_PROVIDER_SITE_OTHER)
Admission: RE | Admit: 2019-08-05 | Discharge: 2019-08-05 | Disposition: A | Discharge status: Home / Self Care | Payer: Self-pay | Source: Ambulatory Visit | Attending: Cardiology | Admitting: Cardiology

## 2019-08-05 DIAGNOSIS — R0600 Dyspnea, unspecified: Secondary | ICD-10-CM

## 2019-08-05 DIAGNOSIS — R072 Precordial pain: Secondary | ICD-10-CM

## 2019-08-05 IMAGING — CT CT HEART SCORING
2 series · 16 of 20 positions shown, 18 images · non-contrast
Comparison: None.
COMPARISON: None.

Addendum:
EXAM:
OVER-READ INTERPRETATION  CT CHEST

The following report is an over-read performed by radiologist Dr.
Gongadze Kyuregyan [REDACTED] on 08/05/2019. This over-read
does not include interpretation of cardiac or coronary anatomy or
pathology. The calcium score interpretation by the cardiologist is
attached.
CLINICAL DATA: Risk stratification
Coronary Calcium Score
TECHNIQUE: The patient was scanned on a Siemens Somatom 64 slice scanner. Axial
non-contrast 3 mm slices were carried out through the heart. The
data set was analyzed on a dedicated work station and scored using
the Agatson method.

[Series 2: casc 3.0 i36f 2 bestdiast 75 % · axial · 0.46mm/px · z∈[-242,-143]mm · 8 of 43 slices shown, 10 images]
[im 5/43  vessel]
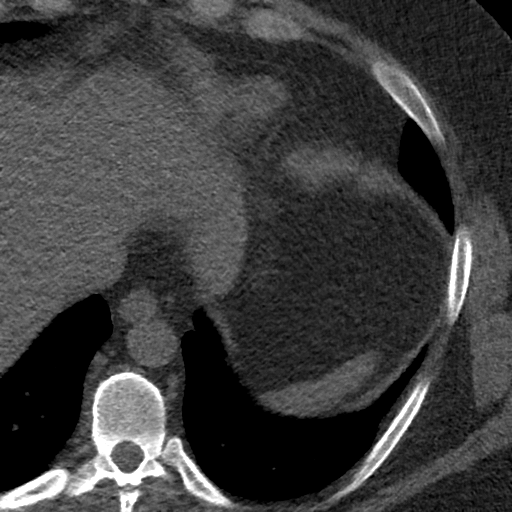
[im 5/43  lung]
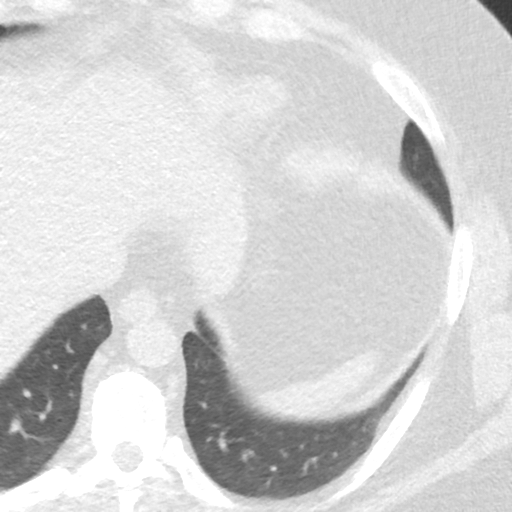
[im 10/43  vessel]
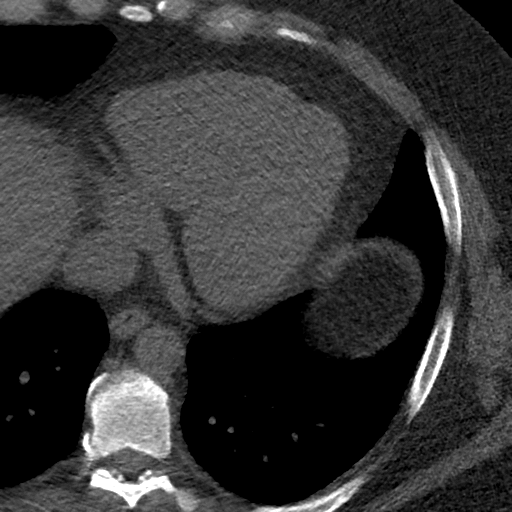
[im 15/43  vessel]
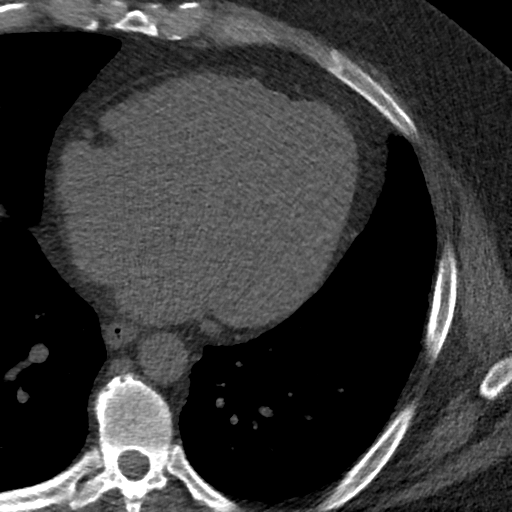
[im 19/43  vessel]
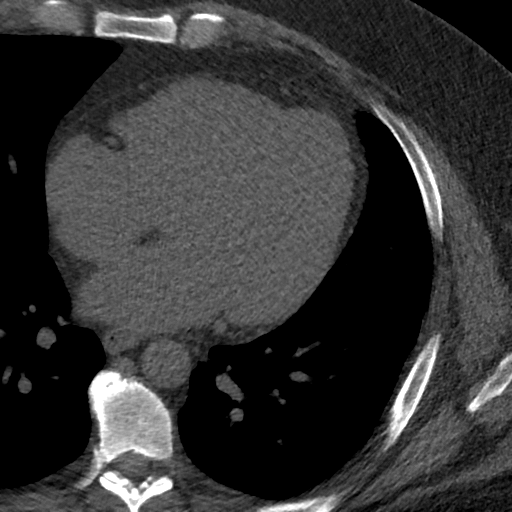
[im 24/43  vessel]
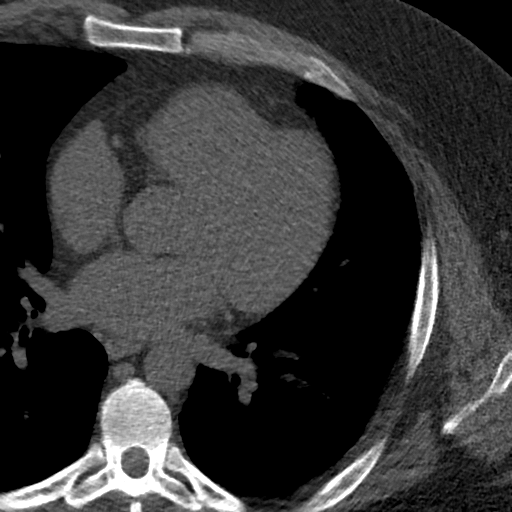
[im 24/43  lung]
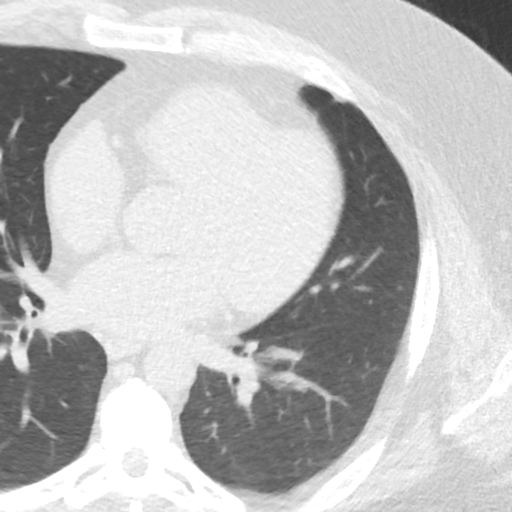
[im 29/43  vessel]
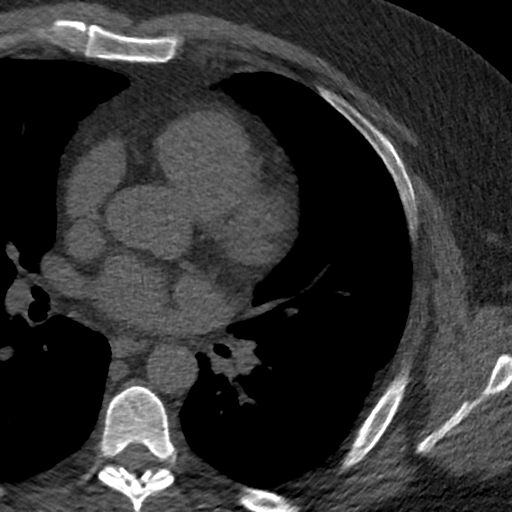
[im 33/43  vessel]
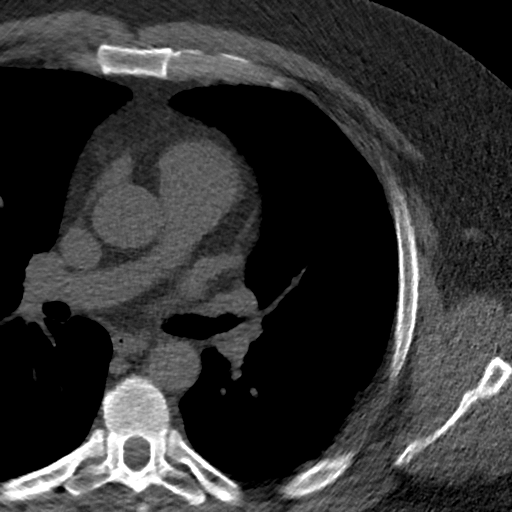
[im 38/43  vessel]
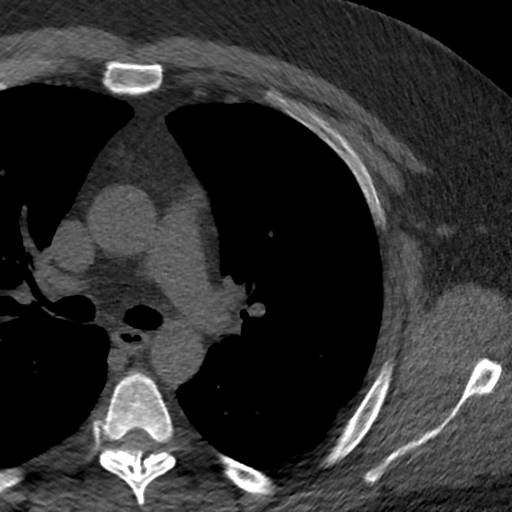

[Series 4: lung st 75 % · axial · 0.73mm/px · z∈[-242,-143]mm · 8 of 43 slices shown]
[im 5/43  lung]
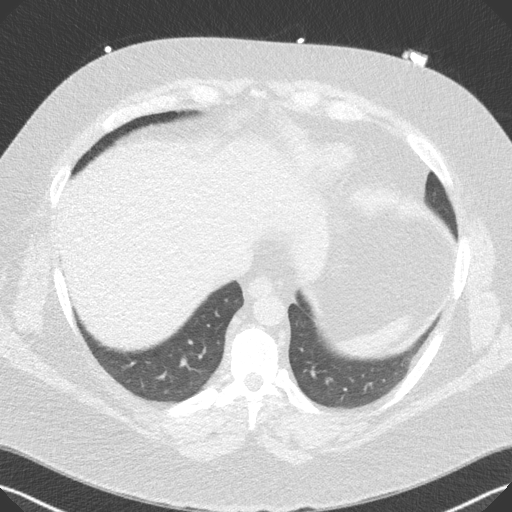
[im 10/43  lung]
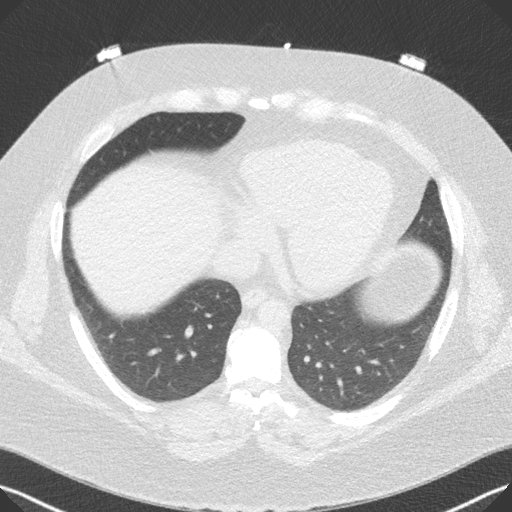
[im 15/43  lung]
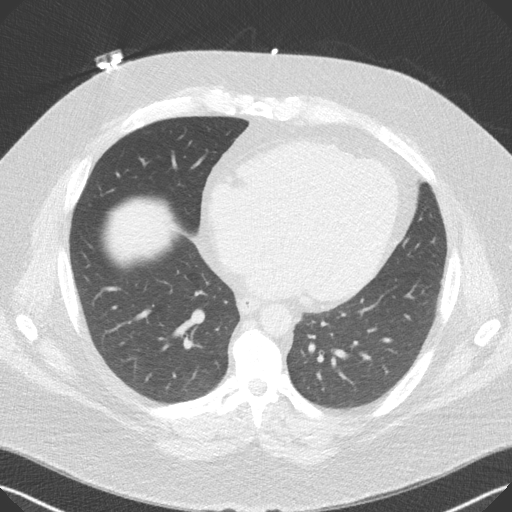
[im 19/43  lung]
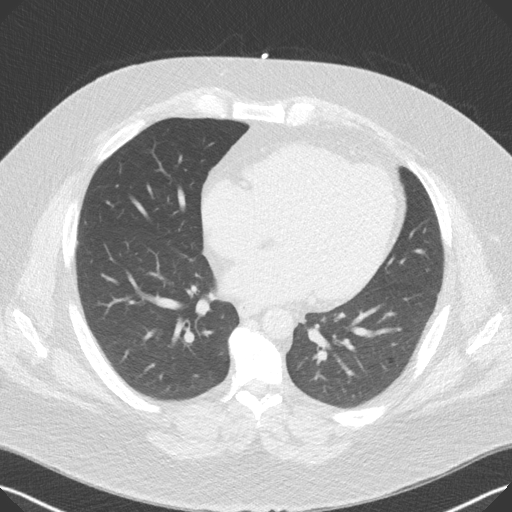
[im 24/43  lung]
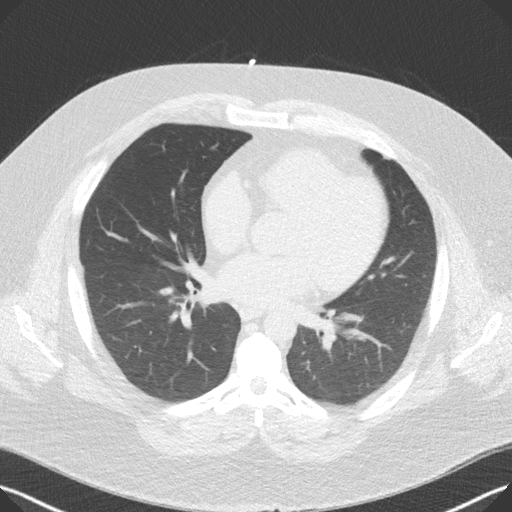
[im 29/43  lung]
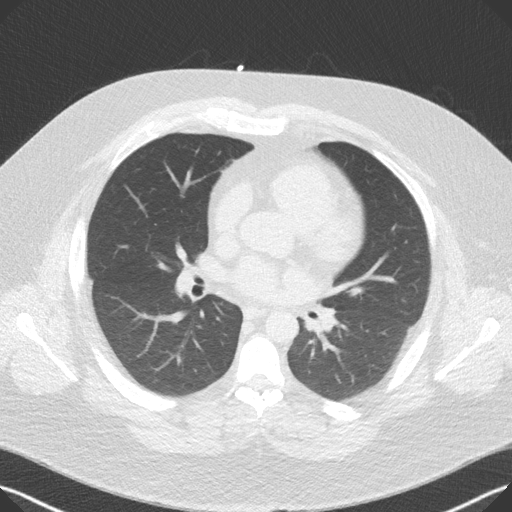
[im 33/43  lung]
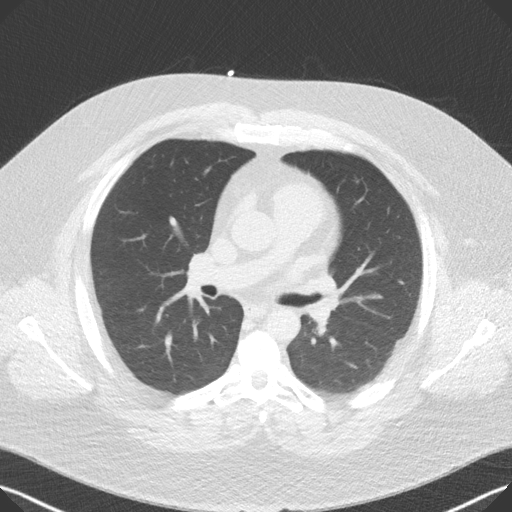
[im 38/43  lung]
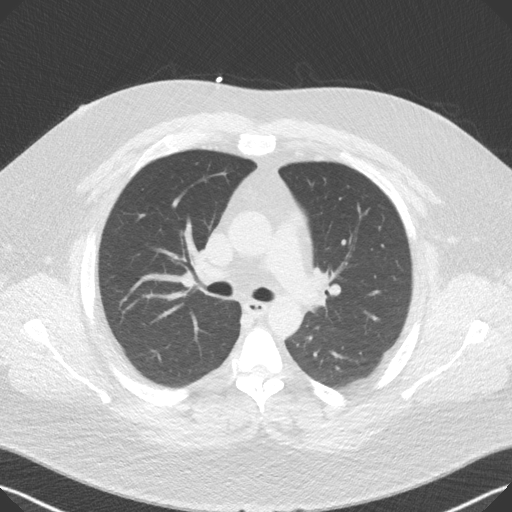

[16 of 20 positions shown; findings below may reference images not displayed]

FINDINGS: Vascular: Normal aortic caliber.

Mediastinum/Nodes: No imaged thoracic adenopathy.

Lungs/Pleura: No imaged pleural fluid. Clear imaged lungs.

Upper Abdomen: Normal imaged portions of the liver, spleen.

Musculoskeletal: Mid and lower thoracic spondylosis.
IMPRESSION: No significant extracardiac findings within the imaged chest.
FINDINGS: Non-cardiac: See separate report from [REDACTED].

Ascending aorta: Normal size

Pericardium: Normal

Coronary arteries: Normal origin
IMPRESSION: Coronary calcium score of 8.77. This was 79 percentile for age and
sex matched control.

Paulus N Ceejay

*** End of Addendum ***
EXAM:
OVER-READ INTERPRETATION  CT CHEST

The following report is an over-read performed by radiologist Dr.
Gongadze Kyuregyan [REDACTED] on 08/05/2019. This over-read
does not include interpretation of cardiac or coronary anatomy or
pathology. The calcium score interpretation by the cardiologist is
attached.
FINDINGS: Vascular: Normal aortic caliber.

Mediastinum/Nodes: No imaged thoracic adenopathy.

Lungs/Pleura: No imaged pleural fluid. Clear imaged lungs.

Upper Abdomen: Normal imaged portions of the liver, spleen.

Musculoskeletal: Mid and lower thoracic spondylosis.
IMPRESSION: No significant extracardiac findings within the imaged chest.

## 2019-08-13 ENCOUNTER — Ambulatory Visit (INDEPENDENT_AMBULATORY_CARE_PROVIDER_SITE_OTHER): Payer: Managed Care, Other (non HMO) | Admitting: Cardiology

## 2019-08-13 ENCOUNTER — Encounter: Payer: Self-pay | Admitting: Cardiology

## 2019-08-13 ENCOUNTER — Other Ambulatory Visit: Payer: Self-pay

## 2019-08-13 DIAGNOSIS — R072 Precordial pain: Secondary | ICD-10-CM | POA: Diagnosis not present

## 2019-08-13 MED ORDER — METOPROLOL SUCCINATE ER 50 MG PO TB24: 50.0000 mg | ORAL_TABLET | Freq: Every day | ORAL | 1 refills | Status: DC

## 2019-08-13 NOTE — Progress Notes (Signed)
Cardiology Office Note:    Date:  08/13/2019   ID:  Patrick Christensen, DOB Dec 08, 1970, MRN [id number]  PCP:  Nonnie Done., MD  Cardiologist:  Gypsy Balsam, MD    Referring MD: Nonnie Done., MD   Chief Complaint  Patient presents with  . Shortness of Breath  . Chest Pain    History of Present Illness:    Patrick Christensen is a 48 y.o. male who was originally referred to me because of shortness of breath.  Overall last time I put him on a small dose of beta-blocker he feels better.  He did have echocardiogram done which showed preserved left ventricle ejection fraction, right ventricle was normal in size and function no pulmonary hypertension.  He also got calcium score done I do not have results of it however I look at the CAT scan myself today we will collect a small deposit of calcium at the end of left main obviously we wait for the official report of this however evaluation for obstructive disease needs to be done depends of body of calcium score would decide which way to proceed.  In the meantime I asked him to increase dose of his Toprol-XL to 50 mg daily he is already taking aspirin which I will continue.  We will check his fasting lipid profile today.  We also talked about sleep study and sleep apnea.  He will get new mask and new equipment.  Past Medical History:  Diagnosis Date  . CTS (carpal tunnel syndrome)   . GERD (gastroesophageal reflux disease)   . Hypertension   . Migraine     Past Surgical History:  Procedure Laterality Date  . CARPAL TUNNEL RELEASE    . MANDIBLE SURGERY    . NASAL POLYP SURGERY    . SKIN GRAFT SPLIT THICKNESS LEG / FOOT      Current Medications: Current Meds  Medication Sig  . amLODipine (NORVASC) 5 MG tablet Take 5 mg by mouth daily.   . cyclobenzaprine (FLEXERIL) 5 MG tablet Take 1 tablet (5 mg total) by mouth at bedtime as needed for muscle spasms.  . [DISCONTINUED] AIMOVIG 70 MG/ML SOAJ INJECT 70 MG INTO THE SKIN EVERY 30  (THIRTY) DAYS.  . [DISCONTINUED] SUMAtriptan (IMITREX) 100 MG tablet Take 1 tablet at headache onset.  If no improvement in 2 hours, ok to repeat.  . [DISCONTINUED] topiramate (TOPAMAX) 50 MG tablet Take 1 tablet (50 mg total) by mouth 2 (two) times daily.     Allergies:   Patient has no known allergies.   Social History   Socioeconomic History  . Marital status: Married    Spouse name: Not on file  . Number of children: 2  . Years of education: 78  . Highest education level: Associate degree: occupational, Scientist, product/process development, or vocational program  Occupational History  . Occupation: paramedic    Employer: [employer]  Social Needs  . Financial resource strain: Not on file  . Food insecurity    Worry: Not on file    Inability: Not on file  . Transportation needs    Medical: Not on file    Non-medical: Not on file  Tobacco Use  . Smoking status: Never Smoker  . Smokeless tobacco: Never Used  Substance and Sexual Activity  . Alcohol use: Yes    Comment: occasional  . Drug use: Never  . Sexual activity: Not on file  Lifestyle  . Physical activity    Days per week: Not on  file    Minutes per session: Not on file  . Stress: Not on file  Relationships  . Social Musicianconnections    Talks on phone: Not on file    Gets together: Not on file    Attends religious service: Not on file    Active member of club or organization: Not on file    Attends meetings of clubs or organizations: Not on file    Relationship status: Not on file  Other Topics Concern  . Not on file  Social History Narrative   Lives with wife and 2 children in a one story home.  Works as a Radiation protection practitionerparamedic.  Education: associates degree.  Left handed      Family History: The patient's family history includes CAD in his father; Colon cancer in his maternal grandmother; GER disease in his father and mother; Hyperlipidemia in his father; Hypertension in his father and mother; Lung cancer in his maternal grandfather and  paternal grandfather. ROS:   Please see the history of present illness.    All 14 point review of systems negative except as described per history of present illness  EKGs/Labs/Other Studies Reviewed:      Recent Labs: No results found for requested labs within last 8760 hours.  Recent Lipid Panel No results found for: CHOL, TRIG, HDL, CHOLHDL, VLDL, LDLCALC, LDLDIRECT  Physical Exam:    VS:  BP 120/64   Pulse (!) 115   Ht 5\' 11"  (1.803 m)   Wt (!) 305 lb 6.4 oz (138.5 kg)   SpO2 97%   BMI 42.59 kg/m     Wt Readings from Last 3 Encounters:  08/13/19 (!) 310 lb 1.9 oz (140.7 kg)  07/20/19 300 lb (136.1 kg)  06/21/19 (!) 305 lb 6.4 oz (138.5 kg)     GEN:  Well nourished, well developed in no acute distress HEENT: Normal NECK: No JVD; No carotid bruits LYMPHATICS: No lymphadenopathy CARDIAC: RRR, no murmurs, no rubs, no gallops RESPIRATORY:  Clear to auscultation without rales, wheezing or rhonchi  ABDOMEN: Soft, non-tender, non-distended MUSCULOSKELETAL:  No edema; No deformity  SKIN: Warm and dry LOWER EXTREMITIES: no swelling NEUROLOGIC:  Alert and oriented x 3 PSYCHIATRIC:  Normal affect   ASSESSMENT:    1. Dyspnea on exertion   2. Precordial chest pain   3. Obstructive sleep apnea   4. Morbid obesity (HCC)    PLAN:    In order of problems listed above:  1. Dyspnea exertion probably multifactorial feeling better with beta-blocker awaiting results of the calcium score. 2. Precordial chest pain with minimal deposit of calcium seen on the CT.  Awaiting official report of the calcium score. 3. Asthma with obstructive sleep apnea.  He is determined to get new equipment hopefully he will start using it and start feeling better. 4. Morbid obesity Patrick Christensen understands the problem and try to manage this better.   Medication Adjustments/Labs and Tests Ordered: Current medicines are reviewed at length with the patient today.  Concerns regarding medicines are outlined  above.  Orders Placed This Encounter  Procedures  . CT CARDIAC SCORING  . EKG 12-Lead  . ECHOCARDIOGRAM COMPLETE   Medication changes:  Meds ordered this encounter  Medications  . metoprolol succinate (TOPROL-XL) 25 MG 24 hr tablet    Sig: Take 1 tablet (25 mg total) by mouth daily. Take with or immediately following a meal.    Dispense:  90 tablet    Refill:  1  . nitroGLYCERIN (NITROSTAT) 0.4 MG  SL tablet    Sig: Place 1 tablet (0.4 mg total) under the tongue every 5 (five) minutes as needed for chest pain.    Dispense:  25 tablet    Refill:  11    Signed, Park Liter, MD, [facility] 08/13/2019 3:25 PM    [place]

## 2019-08-13 NOTE — Patient Instructions (Signed)
  Medication Instructions:  Your physician has recommended you make the following change in your medication: INCREASE Toprol XL to 50 mg daily  *If you need a refill on your cardiac medications before your next appointment, please call your pharmacy*  Lab Work: Your physician recommends that you return for lab work  Today:  Fasting Lipid Profile  If you have labs (blood work) drawn today and your tests are completely normal, you will receive your results only by: Marland Kitchen MyChart Message (if you have MyChart) OR . A paper copy in the mail If you have any lab test that is abnormal or we need to change your treatment, we will call you to review the results.  Testing/Procedures: None Ordereed  Follow-Up: At [facility], you and your health needs are our priority.  As part of our continuing mission to provide you with exceptional heart care, we have created designated Provider Care Teams.  These Care Teams include your primary Cardiologist (physician) and Advanced Practice Providers (APPs -  Physician Assistants and Nurse Practitioners) who all work together to provide you with the care you need, when you need it.  Your next appointment:   1 Month  The format for your next appointment:   In Person  Provider:   You may see Dr. Agustin Cree or the following Advanced Practice Provider on your designated Care Team:    Laurann Montana, FNP

## 2019-08-14 LAB — LIPID PANEL
Chol/HDL Ratio: 4.7 ratio (ref 0.0–5.0)
Cholesterol, Total: 177 mg/dL (ref 100–199)
HDL: 38 mg/dL — ABNORMAL LOW (ref >39)
LDL Chol Calc (NIH): 107 mg/dL — ABNORMAL HIGH (ref 0–99)
Triglycerides: 182 mg/dL — ABNORMAL HIGH (ref 0–149)
VLDL Cholesterol Cal: 32 mg/dL (ref 5–40)

## 2019-08-25 ENCOUNTER — Telehealth: Payer: Self-pay | Admitting: Cardiology

## 2019-08-25 NOTE — Telephone Encounter (Signed)
Patient called and is asking for his calcium score. He said to call him on mobile and leave a message if he does not answer, he is at work.

## 2019-08-26 NOTE — Telephone Encounter (Signed)
Called patient informed him that his results are back I will just have Dr.Krasowski review them so I can inform him. He verbally understands will forward to Dr. Agustin Cree.

## 2019-08-31 NOTE — Telephone Encounter (Signed)
CAC very low, will talk about it during the visit

## 2019-08-31 NOTE — Telephone Encounter (Signed)
Left message for patient to return call.

## 2019-09-01 NOTE — Telephone Encounter (Signed)
Patient informed of results.  No further questions.

## 2019-09-15 ENCOUNTER — Ambulatory Visit (INDEPENDENT_AMBULATORY_CARE_PROVIDER_SITE_OTHER): Payer: Managed Care, Other (non HMO) | Admitting: Cardiology

## 2019-09-15 ENCOUNTER — Encounter: Payer: Self-pay | Admitting: Cardiology

## 2019-09-15 ENCOUNTER — Other Ambulatory Visit: Payer: Self-pay

## 2019-09-15 VITALS — BP 120/70 | HR 72 | Ht 71.0 in | Wt 312.0 lb

## 2019-09-15 DIAGNOSIS — R0600 Dyspnea, unspecified: Secondary | ICD-10-CM

## 2019-09-15 DIAGNOSIS — I2510 Atherosclerotic heart disease of native coronary artery without angina pectoris: Secondary | ICD-10-CM | POA: Diagnosis not present

## 2019-09-15 DIAGNOSIS — E785 Hyperlipidemia, unspecified: Secondary | ICD-10-CM | POA: Diagnosis not present

## 2019-09-15 DIAGNOSIS — R072 Precordial pain: Secondary | ICD-10-CM

## 2019-09-15 DIAGNOSIS — R0609 Other forms of dyspnea: Secondary | ICD-10-CM

## 2019-09-15 MED ORDER — ATORVASTATIN CALCIUM 10 MG PO TABS: 10.0000 mg | ORAL_TABLET | Freq: Every day | ORAL | 1 refills | Status: DC

## 2019-09-15 NOTE — Patient Instructions (Signed)
Medication Instructions:  Your physician has recommended you make the following change in your medication:  START: Lipitor 10 mg daily    *If you need a refill on your cardiac medications before your next appointment, please call your pharmacy*  Lab Work: Your physician recommends that you return for lab work fasting in 6 weeks: Lipids, ast, alt   If you have labs (blood work) drawn today and your tests are completely normal, you will receive your results only by: Marland Kitchen MyChart Message (if you have MyChart) OR . A paper copy in the mail If you have any lab test that is abnormal or we need to change your treatment, we will call you to review the results.  Testing/Procedures: None.   Follow-Up: At [facility], you and your health needs are our priority.  As part of our continuing mission to provide you with exceptional heart care, we have created designated Provider Care Teams.  These Care Teams include your primary Cardiologist (physician) and Advanced Practice Providers (APPs -  Physician Assistants and Nurse Practitioners) who all work together to provide you with the care you need, when you need it.  Your next appointment:   3 month(s)  The format for your next appointment:   In Person  Provider:   You may see Dr. Bing Matter or the following Advanced Practice Provider on your designated Care Team:    Gillian Shields, FNP   Other Instructions  Atorvastatin tablets What is this medicine? ATORVASTATIN (a TORE va sta tin) is known as a HMG-CoA reductase inhibitor or 'statin'. It lowers the level of cholesterol and triglycerides in the blood. This drug may also reduce the risk of heart attack, stroke, or other health problems in patients with risk factors for heart disease. Diet and lifestyle changes are often used with this drug. This medicine may be used for other purposes; ask your health care provider or pharmacist if you have questions. COMMON BRAND NAME(S): Lipitor What  should I tell my health care provider before I take this medicine? They need to know if you have any of these conditions:  diabetes  if you often drink alcohol  history of stroke  kidney disease  liver disease  muscle aches or weakness  thyroid disease  an unusual or allergic reaction to atorvastatin, other medicines, foods, dyes, or preservatives  pregnant or trying to get pregnant  breast-feeding How should I use this medicine? Take this medicine by mouth with a glass of water. Follow the directions on the prescription label. You can take it with or without food. If it upsets your stomach, take it with food. Do not take with grapefruit juice. Take your medicine at regular intervals. Do not take it more often than directed. Do not stop taking except on your doctor's advice. Talk to your pediatrician regarding the use of this medicine in children. While this drug may be prescribed for children as young as 10 for selected conditions, precautions do apply. Overdosage: If you think you have taken too much of this medicine contact a poison control center or emergency room at once. NOTE: This medicine is only for you. Do not share this medicine with others. What if I miss a dose? If you miss a dose, take it as soon as you can. If your next dose is to be taken in less than 12 hours, then do not take the missed dose. Take the next dose at your regular time. Do not take double or extra doses. What may interact  with this medicine? Do not take this medicine with any of the following medications:  dasabuvir; ombitasvir; paritaprevir; ritonavir  ombitasvir; paritaprevir; ritonavir  posaconazole  red yeast rice This medicine may also interact with the following medications:  alcohol  birth control pills  certain antibiotics like erythromycin and clarithromycin  certain antivirals for HIV or hepatitis  certain medicines for cholesterol like fenofibrate, gemfibrozil, and  niacin  certain medicines for fungal infections like ketoconazole and itraconazole  colchicine  cyclosporine  digoxin  grapefruit juice  rifampin This list may not describe all possible interactions. Give your health care provider a list of all the medicines, herbs, non-prescription drugs, or dietary supplements you use. Also tell them if you smoke, drink alcohol, or use illegal drugs. Some items may interact with your medicine. What should I watch for while using this medicine? Visit your doctor or health care professional for regular check-ups. You may need regular tests to make sure your liver is working properly. Your health care professional may tell you to stop taking this medicine if you develop muscle problems. If your muscle problems do not go away after stopping this medicine, contact your health care professional. Do not become pregnant while taking this medicine. Women should inform their health care professional if they wish to become pregnant or think they might be pregnant. There is a potential for serious side effects to an unborn child. Talk to your health care professional or pharmacist for more information. Do not breast-feed an infant while taking this medicine. This medicine may increase blood sugar. Ask your healthcare provider if changes in diet or medicines are needed if you have diabetes. If you are going to need surgery or other procedure, tell your doctor that you are using this medicine. This drug is only part of a total heart-health program. Your doctor or a dietician can suggest a low-cholesterol and low-fat diet to help. Avoid alcohol and smoking, and keep a proper exercise schedule. This medicine may cause a decrease in Co-Enzyme Q-10. You should make sure that you get enough Co-Enzyme Q-10 while you are taking this medicine. Discuss the foods you eat and the vitamins you take with your health care professional. What side effects may I notice from receiving this  medicine? Side effects that you should report to your doctor or health care professional as soon as possible:  allergic reactions like skin rash, itching or hives, swelling of the face, lips, or tongue  fever  joint pain  loss of memory  redness, blistering, peeling or loosening of the skin, including inside the mouth  signs and symptoms of high blood sugar such as being more thirsty or hungry or having to urinate more than normal. You may also feel very tired or have blurry vision.  signs and symptoms of liver injury like dark yellow or brown urine; general ill feeling or flu-like symptoms; light-belly pain; unusually weak or tired; yellowing of the eyes or skin  signs and symptoms of muscle injury like dark urine; trouble passing urine or change in the amount of urine; unusually weak or tired; muscle pain or side or back pain Side effects that usually do not require medical attention (report to your doctor or health care professional if they continue or are bothersome):  diarrhea  nausea  stomach pain  trouble sleeping  upset stomach This list may not describe all possible side effects. Call your doctor for medical advice about side effects. You may report side effects to FDA at 1-800-FDA-1088. Where  should I keep my medicine? Keep out of the reach of children. Store between 20 and 25 degrees C (68 and 77 degrees F). Throw away any unused medicine after the expiration date. NOTE: This sheet is a summary. It may not cover all possible information. If you have questions about this medicine, talk to your doctor, pharmacist, or health care provider.  2020 Elsevier/Gold Standard (2018-08-05 11:36:16)

## 2019-09-15 NOTE — Progress Notes (Signed)
Cardiology Office Note:    Date:  09/15/2019   ID:  [name], DOB 1971-04-25, MRN [id number]  PCP:  Nonnie Done., MD  Cardiologist:  Gypsy Balsam, MD    Referring MD: Nonnie Done., MD   Chief Complaint  Patient presents with  . Follow-up  Doing well  History of Present Illness:    Patrick Christensen is a 48 y.o. male who was referred to [place] because of shortness of breath.  Echocardiogram done showed normal left ventricle ejection fraction.  We also end up doing calcium score trying to see if he got any calcium deposit calcium score came as 8.7 which is 79% all of this year.  He does have dyslipidemia with LDL of 107 overall doing well.  Denies having any chest pain, tightness, squeezing, pressure burning chest still short of breath but overall seems to be doing better.  Previously had some atypical chest pain but now he does not have any.  Past Medical History:  Diagnosis Date  . CTS (carpal tunnel syndrome)   . GERD (gastroesophageal reflux disease)   . Hypertension   . Migraine     Past Surgical History:  Procedure Laterality Date  . CARPAL TUNNEL RELEASE    . MANDIBLE SURGERY    . NASAL POLYP SURGERY    . SKIN GRAFT SPLIT THICKNESS LEG / FOOT      Current Medications: Current Meds  Medication Sig  . amLODipine (NORVASC) 5 MG tablet Take 5 mg by mouth daily.   Marland Kitchen aspirin EC 81 MG tablet Take 81 mg by mouth daily.  . cyclobenzaprine (FLEXERIL) 5 MG tablet Take 1 tablet (5 mg total) by mouth at bedtime as needed for muscle spasms.  . metoprolol succinate (TOPROL-XL) 50 MG 24 hr tablet Take 1 tablet (50 mg total) by mouth daily. Take with or immediately following a meal.  . nitroGLYCERIN (NITROSTAT) 0.4 MG SL tablet Place 1 tablet (0.4 mg total) under the tongue every 5 (five) minutes as needed for chest pain.  . SUMAtriptan (IMITREX) 100 MG tablet Take 1 tablet at headache onset.  If no improvement in 2 hours, ok to repeat.  . topiramate (TOPAMAX) 50 MG  tablet Take 1.5 tablets (75 mg total) by mouth 2 (two) times daily.     Allergies:   Patient has no known allergies.   Social History   Socioeconomic History  . Marital status: Married    Spouse name: Not on file  . Number of children: 2  . Years of education: 48  . Highest education level: Associate degree: occupational, Scientist, product/process development, or vocational program  Occupational History  . Occupation: paramedic    Employer: [employer]  Social Needs  . Financial resource strain: Not on file  . Food insecurity    Worry: Not on file    Inability: Not on file  . Transportation needs    Medical: Not on file    Non-medical: Not on file  Tobacco Use  . Smoking status: Never Smoker  . Smokeless tobacco: Never Used  Substance and Sexual Activity  . Alcohol use: Yes    Comment: occasional  . Drug use: Never  . Sexual activity: Not on file  Lifestyle  . Physical activity    Days per week: Not on file    Minutes per session: Not on file  . Stress: Not on file  Relationships  . Social Musician on phone: Not on file    Gets  together: Not on file    Attends religious service: Not on file    Active member of club or organization: Not on file    Attends meetings of clubs or organizations: Not on file    Relationship status: Not on file  Other Topics Concern  . Not on file  Social History Narrative   Lives with wife and 2 children in a one story home.  Works as a Radiation protection practitionerparamedic.  Education: associates degree.  Left handed      Family History: The patient's family history includes CAD in his father; Colon cancer in his maternal grandmother; GER disease in his father and mother; Hyperlipidemia in his father; Hypertension in his father and mother; Lung cancer in his maternal grandfather and paternal grandfather. ROS:   Please see the history of present illness.    All 14 point review of systems negative except as described per history of present illness  EKGs/Labs/Other Studies  Reviewed:      Recent Labs: No results found for requested labs within last 8760 hours.  Recent Lipid Panel    Component Value Date/Time   CHOL 177 08/13/2019 1540   TRIG 182 (H) 08/13/2019 1540   HDL 38 (L) 08/13/2019 1540   CHOLHDL 4.7 08/13/2019 1540   LDLCALC 107 (H) 08/13/2019 1540    Physical Exam:    VS:  BP 120/70   Pulse 72   Ht 5\' 11"  (1.803 m)   Wt (!) 312 lb (141.5 kg)   SpO2 96%   BMI 43.52 kg/m     Wt Readings from Last 3 Encounters:  09/15/19 (!) 312 lb (141.5 kg)  08/13/19 (!) 310 lb 1.9 oz (140.7 kg)  07/20/19 300 lb (136.1 kg)     GEN:  Well nourished, well developed in no acute distress HEENT: Normal NECK: No JVD; No carotid bruits LYMPHATICS: No lymphadenopathy CARDIAC: RRR, no murmurs, no rubs, no gallops RESPIRATORY:  Clear to auscultation without rales, wheezing or rhonchi  ABDOMEN: Soft, non-tender, non-distended MUSCULOSKELETAL:  No edema; No deformity  SKIN: Warm and dry LOWER EXTREMITIES: no swelling NEUROLOGIC:  Alert and oriented x 3 PSYCHIATRIC:  Normal affect   ASSESSMENT:    1. Dyspnea on exertion   2. Coronary artery disease involving native coronary artery of native heart without angina pectoris   3. Dyslipidemia   4. Morbid obesity (HCC)   5. Precordial chest pain    PLAN:    In order of problems listed above:  1. Coronary disease as proven by calcium score being 8.7.  He does not have any symptoms right now I think we need to aggressively manage his risk factors and there was a value on getting calcium score.  He is already on aspirin which I will continue I will put him on a small dose of statin will start with 10 mg of Lipitor.  In about 6 weeks we will recheck his fasting lipid profile.  He does not have any symptoms however in the future we may be forced to do stress test but I prefer to do exercise treadmill stress test.  Hopefully coronavirus situation will improve and will be able to do the test. 2. Dyslipidemia we  will start small dose of statin only Lipitor 10 mg daily fasting lipid profile will be made in 6 weeks 3. Dyspnea on exertion improving he is trying to be a little more active.  Overall he is active because of his work. 4. Morbid obesity he understands a problem trying  to lose the weight. 5. Precordial chest pain denies having any but again I told him if he still having pain or pain became worse you need to let me know then will push towards doing quicker stress test.   Medication Adjustments/Labs and Tests Ordered: Current medicines are reviewed at length with the patient today.  Concerns regarding medicines are outlined above.  No orders of the defined types were placed in this encounter.  Medication changes: No orders of the defined types were placed in this encounter.   Signed, Park Liter, MD, [facility] 09/15/2019 8:46 AM    [place]

## 2019-09-15 NOTE — Addendum Note (Signed)
Addended by: Ashok Norris on: 09/15/2019 08:53 AM   Modules accepted: Orders

## 2019-12-23 ENCOUNTER — Ambulatory Visit: Payer: Managed Care, Other (non HMO) | Admitting: Cardiology

## 2019-12-23 ENCOUNTER — Other Ambulatory Visit: Payer: Self-pay

## 2019-12-23 ENCOUNTER — Encounter: Payer: Self-pay | Admitting: Cardiology

## 2019-12-23 VITALS — BP 122/80 | HR 76 | Ht 71.0 in | Wt 315.8 lb

## 2019-12-23 DIAGNOSIS — I2510 Atherosclerotic heart disease of native coronary artery without angina pectoris: Secondary | ICD-10-CM | POA: Diagnosis not present

## 2019-12-23 DIAGNOSIS — R0600 Dyspnea, unspecified: Secondary | ICD-10-CM | POA: Diagnosis not present

## 2019-12-23 DIAGNOSIS — R0609 Other forms of dyspnea: Secondary | ICD-10-CM

## 2019-12-23 DIAGNOSIS — E785 Hyperlipidemia, unspecified: Secondary | ICD-10-CM

## 2019-12-23 MED ORDER — ATORVASTATIN CALCIUM 10 MG PO TABS: 10.0000 mg | ORAL_TABLET | Freq: Every day | ORAL | 1 refills | Status: AC

## 2019-12-23 NOTE — Addendum Note (Signed)
Addended by: Lita Mains on: 12/23/2019 11:12 AM   Modules accepted: Orders

## 2019-12-23 NOTE — Progress Notes (Signed)
Cardiology Office Note:    Date:  12/23/2019   ID:  BISHOP VANDERWERF, DOB 1971/01/31, MRN [id number]  PCP:  Nonnie Done., MD  Cardiologist:  Gypsy Balsam, MD    Referring MD: Nonnie Done., MD   No chief complaint on file. I had COVID-19 infection  History of Present Illness:    Patrick Christensen is a 49 y.o. male with past medical history significant for coronary artery disease based on calcium score being 8.7, morbid obesity, dyslipidemia, dyspnea on exertion.  Comes today to my office for follow-up of all he is recovering after Covid infection doing better but he said he got a rough course for about 2 to 3 weeks he was very sick.  Shortness of breath slightly worse.  He did not start any exercises.  He did not start Lipitor either.  Denies have any chest pain tightness squeezing pressure burning chest.  Dyspnea on exertion is as usual.  Past Medical History:  Diagnosis Date  . CTS (carpal tunnel syndrome)   . GERD (gastroesophageal reflux disease)   . Hypertension   . Migraine     Past Surgical History:  Procedure Laterality Date  . CARPAL TUNNEL RELEASE    . MANDIBLE SURGERY    . NASAL POLYP SURGERY    . SKIN GRAFT SPLIT THICKNESS LEG / FOOT      Current Medications: Current Meds  Medication Sig  . amLODipine (NORVASC) 5 MG tablet Take 5 mg by mouth daily.   Marland Kitchen aspirin EC 81 MG tablet Take 81 mg by mouth daily.  Marland Kitchen atorvastatin (LIPITOR) 10 MG tablet Take 1 tablet (10 mg total) by mouth daily.  . cyclobenzaprine (FLEXERIL) 5 MG tablet Take 1 tablet (5 mg total) by mouth at bedtime as needed for muscle spasms.  . metoprolol succinate (TOPROL-XL) 50 MG 24 hr tablet Take 1 tablet (50 mg total) by mouth daily. Take with or immediately following a meal.  . nitroGLYCERIN (NITROSTAT) 0.4 MG SL tablet Place 1 tablet (0.4 mg total) under the tongue every 5 (five) minutes as needed for chest pain.  . SUMAtriptan (IMITREX) 100 MG tablet Take 1 tablet at headache onset.  If  no improvement in 2 hours, ok to repeat.  . topiramate (TOPAMAX) 50 MG tablet Take 1.5 tablets (75 mg total) by mouth 2 (two) times daily.     Allergies:   Patient has no known allergies.   Social History   Socioeconomic History  . Marital status: Married    Spouse name: Not on file  . Number of children: 2  . Years of education: 56  . Highest education level: Associate degree: occupational, Scientist, product/process development, or vocational program  Occupational History  . Occupation: paramedic    Employer: [employer]  Tobacco Use  . Smoking status: Never Smoker  . Smokeless tobacco: Never Used  Substance and Sexual Activity  . Alcohol use: Yes    Comment: occasional  . Drug use: Never  . Sexual activity: Not on file  Other Topics Concern  . Not on file  Social History Narrative   Lives with wife and 2 children in a one story home.  Works as a Radiation protection practitioner.  Education: associates degree.  Left handed    Social Determinants of Health   Financial Resource Strain:   . Difficulty of Paying Living Expenses: Not on file  Food Insecurity:   . Worried About Programme researcher, broadcasting/film/video in the Last Year: Not on file  . Ran Out  of Food in the Last Year: Not on file  Transportation Needs:   . Lack of Transportation (Medical): Not on file  . Lack of Transportation (Non-Medical): Not on file  Physical Activity:   . Days of Exercise per Week: Not on file  . Minutes of Exercise per Session: Not on file  Stress:   . Feeling of Stress : Not on file  Social Connections:   . Frequency of Communication with Friends and Family: Not on file  . Frequency of Social Gatherings with Friends and Family: Not on file  . Attends Religious Services: Not on file  . Active Member of Clubs or Organizations: Not on file  . Attends Archivist Meetings: Not on file  . Marital Status: Not on file     Family History: The patient's family history includes CAD in his father; Colon cancer in his maternal grandmother; GER  disease in his father and mother; Hyperlipidemia in his father; Hypertension in his father and mother; Lung cancer in his maternal grandfather and paternal grandfather. ROS:   Please see the history of present illness.    All 14 point review of systems negative except as described per history of present illness  EKGs/Labs/Other Studies Reviewed:      Recent Labs: No results found for requested labs within last 8760 hours.  Recent Lipid Panel    Component Value Date/Time   CHOL 177 08/13/2019 1540   TRIG 182 (H) 08/13/2019 1540   HDL 38 (L) 08/13/2019 1540   CHOLHDL 4.7 08/13/2019 1540   LDLCALC 107 (H) 08/13/2019 1540    Physical Exam:    VS:  BP 122/80   Pulse 76   Ht 5\' 11"  (1.803 m)   Wt (!) 315 lb 12.8 oz (143.2 kg)   SpO2 98%   BMI 44.05 kg/m     Wt Readings from Last 3 Encounters:  12/23/19 (!) 315 lb 12.8 oz (143.2 kg)  09/15/19 (!) 312 lb (141.5 kg)  08/13/19 (!) 310 lb 1.9 oz (140.7 kg)     GEN:  Well nourished, well developed in no acute distress HEENT: Normal NECK: No JVD; No carotid bruits LYMPHATICS: No lymphadenopathy CARDIAC: RRR, no murmurs, no rubs, no gallops RESPIRATORY:  Clear to auscultation without rales, wheezing or rhonchi  ABDOMEN: Soft, non-tender, non-distended MUSCULOSKELETAL:  No edema; No deformity  SKIN: Warm and dry LOWER EXTREMITIES: no swelling NEUROLOGIC:  Alert and oriented x 3 PSYCHIATRIC:  Normal affect   ASSESSMENT:    1. Coronary artery disease involving native coronary artery of native heart without angina pectoris   2. Dyspnea on exertion   3. Morbid obesity ([place])   4. Dyslipidemia    PLAN:    In order of problems listed above:  1. Coronary disease stable calcium score 8.7.  Completely asymptomatic we will continue conservative approach.  In the future we will reconsider possibility of stress testing if we will be able to do exercise Cardiolite. 2. Dyspnea on exertion multifactorial obesity play significant role,  recent COVID-19 play significant fall, his echocardiogram showed preserved left ventricle ejection fraction.  We will continue monitoring the situation, I encouraged him to start exercising on a regular basis. 3. Morbid obesity: Noted he gained some weight.  Again hopefully with exercises he will be able to drop some. 4. Dyslipidemia he did not start his Lipitor.  I encouraged him to start Lipitor 10 mg daily.   Medication Adjustments/Labs and Tests Ordered: Current medicines are reviewed at length with  the patient today.  Concerns regarding medicines are outlined above.  No orders of the defined types were placed in this encounter.  Medication changes: No orders of the defined types were placed in this encounter.   Signed, Georgeanna Lea, MD, [facility] 12/23/2019 11:02 AM    [facility]

## 2019-12-23 NOTE — Patient Instructions (Signed)

## 2020-01-13 ENCOUNTER — Encounter: Payer: Self-pay | Admitting: Neurology

## 2020-01-14 ENCOUNTER — Other Ambulatory Visit: Payer: Self-pay

## 2020-01-14 ENCOUNTER — Telehealth (INDEPENDENT_AMBULATORY_CARE_PROVIDER_SITE_OTHER): Payer: Managed Care, Other (non HMO) | Admitting: Neurology

## 2020-01-14 VITALS — Ht 71.0 in | Wt 310.0 lb

## 2020-01-14 DIAGNOSIS — IMO0002 Reserved for concepts with insufficient information to code with codable children: Secondary | ICD-10-CM

## 2020-01-14 DIAGNOSIS — G43709 Chronic migraine without aura, not intractable, without status migrainosus: Secondary | ICD-10-CM

## 2020-01-14 DIAGNOSIS — M542 Cervicalgia: Secondary | ICD-10-CM

## 2020-01-14 MED ORDER — TOPIRAMATE 100 MG PO TABS: 100.0000 mg | ORAL_TABLET | Freq: Two times a day (BID) | ORAL | 3 refills | Status: DC

## 2020-01-14 NOTE — Progress Notes (Signed)
   Virtual Visit via Video Note The purpose of this virtual visit is to provide medical care while limiting exposure to the novel coronavirus.    Consent was obtained for video visit:  Yes.   Answered questions that patient had about telehealth interaction:  Yes.   I discussed the limitations, risks, security and privacy concerns of performing an evaluation and management service by telemedicine. I also discussed with the patient that there may be a patient responsible charge related to this service. The patient expressed understanding and agreed to proceed.  Pt location: Home Physician Location: office Name of referring provider:  Nonnie Done., MD I connected with Patrick Christensen at patients initiation/request on 01/14/2020 at  8:30 AM EDT by video enabled telemedicine application and verified that I am speaking with the correct person using two identifiers. Pt MRN:  [id number] Pt DOB:  [birth year] Video Participants:  Patrick Christensen   History of Present Illness: This is a 49 y.o. male returning for follow-up of migraines.  At his last visit, I increased topiramate to 75mg  twice daily, which has reduced intensity and frequency of headaches. He gets about 6-7 headaches per month and therefore takes less imitrex.  Imitrex continues to be effective for severe migraine. Previously, he was having about 8-15 headache days per month.   He has transitioned to 12-hour day (7A - 7P) since the beginning of the year which has helped his sleep hygeiene.   Unfortunately, his CPAP machine is broken so he has not been using it.  Neck pain has improved, he rarely takes flexeril because it makes him sleepy.   Observations/Objective:   Vitals:   01/13/20 1159  Weight: (!) 310 lb (140.6 kg)  Height: 5\' 11"  (1.803 m)   Patient is awake, alert, and appears comfortable.  Oriented x 4.   Extraocular muscles are intact. No ptosis.  Face is symmetric.  Speech is not dysarthric.  Antigravity in all  extremities.   Gait appears normal **.   Assessment and Plan:  1.  Chronic migraine, improved headache frequency to 6-7 times per month since increasing topiramate.   - Previously tried:  Depakote, wellbutrin, amitriptyline, verapamil, venlafaxine, Aimovig  - Increase topiramate to 100mg  twice daily.  Side effects discussed  - If he develops adverse effect, transition to extended release tablets  - Recommend he follow-up with his PCP about getting a new CPAP machine as untreated OSA is contributing to his headaches  2.  Episodic migraine without aura  - Previously tried:  Maxalt, Zomig  - Continue Imitrex 100mg  for severe migraine  3.  Cervicalgia, stable  - OK to use flexeril 5mg  as needed  4.  Bilateral CTS s/p CTS release on the right   Follow Up Instructions:   I discussed the assessment and treatment plan with the patient. The patient was provided an opportunity to ask questions and all were answered. The patient agreed with the plan and demonstrated an understanding of the instructions.   The patient was advised to call back or seek an in-person evaluation if the symptoms worsen or if the condition fails to improve as anticipated.  Follow-up in 1 year  01/15/20, DO

## 2020-03-12 ENCOUNTER — Other Ambulatory Visit: Payer: Self-pay | Admitting: Cardiology

## 2020-09-10 ENCOUNTER — Other Ambulatory Visit: Payer: Self-pay | Admitting: Neurology

## 2020-09-10 ENCOUNTER — Other Ambulatory Visit: Payer: Self-pay | Admitting: Cardiology

## 2021-01-11 NOTE — Progress Notes (Signed)
Follow-up Visit   Date: 01/12/21   DAVYON FISCH MRN: [id number] DOB: [birth year]   Interim History: Patrick Christensen is a 50 y.o. left-handed Caucasian male with GERD, hypertension, and migraines returning to the clinic for follow-up of headaches.  The patient was accompanied to the clinic by self.  His migraines are better controlled on topiramate 100mg  BID and occur about 6 times per month.  He dmits to skipping his nighttime dose about 3-4 times per week, but has not noticed that this has intensified his headaches more.  Migraines remain responsive to imitrex and he is no longer running out of his one month supply of imitrex.  When severe, he also takes NSAIDs which helps.  He is working 12 hour shifts, which is better, but the level of stress is more with staffing shortages.   Medications:  Current Outpatient Medications on File Prior to Visit  Medication Sig Dispense Refill  . amLODipine (NORVASC) 5 MG tablet Take 5 mg by mouth daily.     atorvastatin (LIPITOR) 10 MG tablet Take 1 tablet (10 mg total) by mouth daily. 90 tablet 1  . metoprolol succinate (TOPROL-XL) 50 MG 24 hr tablet Take 1 tablet (50 mg total) by mouth daily. Take with or immediately following a meal. 90 tablet 2  . albuterol (VENTOLIN HFA) 108 (90 Base) MCG/ACT inhaler Inhale 2 puffs into the lungs every 6 (six) hours as needed.    Marland Kitchen aspirin EC 81 MG tablet Take 81 mg by mouth daily. (Patient not taking: Reported on 01/12/2021)    . cyclobenzaprine (FLEXERIL) 5 MG tablet Take 1 tablet (5 mg total) by mouth at bedtime as needed for muscle spasms. (Patient not taking: Reported on 01/12/2021) 30 tablet 2  . nitroGLYCERIN (NITROSTAT) 0.4 MG SL tablet Place 1 tablet (0.4 mg total) under the tongue every 5 (five) minutes as needed for chest pain. 25 tablet 11  . omeprazole (PRILOSEC) 40 MG capsule Take 40 mg by mouth daily.    . phentermine (ADIPEX-P) 37.5 MG tablet Take 37.5 mg by mouth daily.     No current  facility-administered medications on file prior to visit.    Allergies: No Known Allergies   Vital Signs:  BP 110/75 (BP Location: Left Arm, Patient Position: Sitting, Cuff Size: Normal)   Pulse 67   Ht 5\' 11"  (1.803 m)   Wt 288 lb 9.6 oz (130.9 kg)   SpO2 99%   BMI 40.25 kg/m    Neurological Exam: MENTAL STATUS including orientation to time, place, person, recent and remote memory, attention span and concentration, language, and fund of knowledge is normal.  Speech is not dysarthric.  CRANIAL NERVES:  No visual field defects.  Pupils equal round and reactive to light.  Normal conjugate, extra-ocular eye movements in all directions of gaze.  No ptosis.  Face is symmetric. Palate elevates symmetrically.  Tongue is midline.  MOTOR:  Motor strength is 5/5 in all extremities  REFLEXES:  Reflexes are 2+/4 throughout  COORDINATION/GAIT:    Gait narrow based and stable.   Data:n/a  IMPRESSION/PLAN: 1.  Chronic migraine without aura, improved headache frequency to about 6 per month  - Previously tried:  Depakote, wellbutrin, amitriptyline, verapamil, venlafaxine, Aimovig  - Continue topiramate 100mg  BID  2.  Episodic migraine without aura  - Previously tried:  Maxalt, Zomig  - Continue imitrex 100mg  for severe migraine  Return to clinic in 1 year   Thank you for allowing me to participate  in patient's care.  If I can answer any additional questions, I would be pleased to do so.    Sincerely,    Tasheka Houseman K. Posey Pronto, DO

## 2021-01-12 ENCOUNTER — Other Ambulatory Visit: Payer: Self-pay

## 2021-01-12 ENCOUNTER — Ambulatory Visit: Payer: Managed Care, Other (non HMO) | Admitting: Neurology

## 2021-01-12 ENCOUNTER — Encounter: Payer: Self-pay | Admitting: Neurology

## 2021-01-12 VITALS — BP 110/75 | HR 67 | Ht 71.0 in | Wt 288.6 lb

## 2021-01-12 DIAGNOSIS — G43009 Migraine without aura, not intractable, without status migrainosus: Secondary | ICD-10-CM

## 2021-01-12 MED ORDER — SUMATRIPTAN SUCCINATE 100 MG PO TABS: ORAL_TABLET | ORAL | 5 refills | Status: DC

## 2021-01-12 MED ORDER — TOPIRAMATE 100 MG PO TABS: 100.0000 mg | ORAL_TABLET | Freq: Two times a day (BID) | ORAL | 11 refills | Status: DC

## 2021-01-12 NOTE — Patient Instructions (Signed)
Return to clinic in 1 year.

## 2021-03-27 ENCOUNTER — Other Ambulatory Visit: Payer: Self-pay | Admitting: Nephrology

## 2021-03-27 DIAGNOSIS — N1832 Chronic kidney disease, stage 3b: Secondary | ICD-10-CM

## 2021-04-12 ENCOUNTER — Ambulatory Visit
Admission: RE | Admit: 2021-04-12 | Discharge: 2021-04-12 | Disposition: A | Discharge status: Home / Self Care | Payer: Managed Care, Other (non HMO) | Source: Ambulatory Visit | Attending: Nephrology | Admitting: Nephrology

## 2021-04-12 DIAGNOSIS — N1832 Chronic kidney disease, stage 3b: Secondary | ICD-10-CM

## 2021-04-12 IMAGING — US US RENAL
1 series · 13 of 25 positions shown · non-contrast
Comparison: 07/19/2014

CT abdomen and pelvis 01/30/2021

CLINICAL DATA: Stage III B chronic kidney disease, history
hypertension, coronary artery disease

EXAM:
RENAL / URINARY TRACT ULTRASOUND COMPLETE

[Series 1: us renal · 0.23mm/px · 13 of 71 slices shown]
[im 1/71]
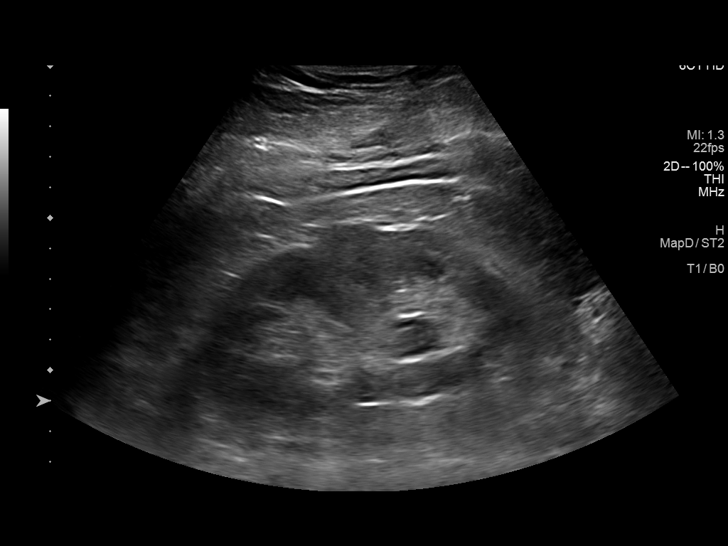
[im 6/71]
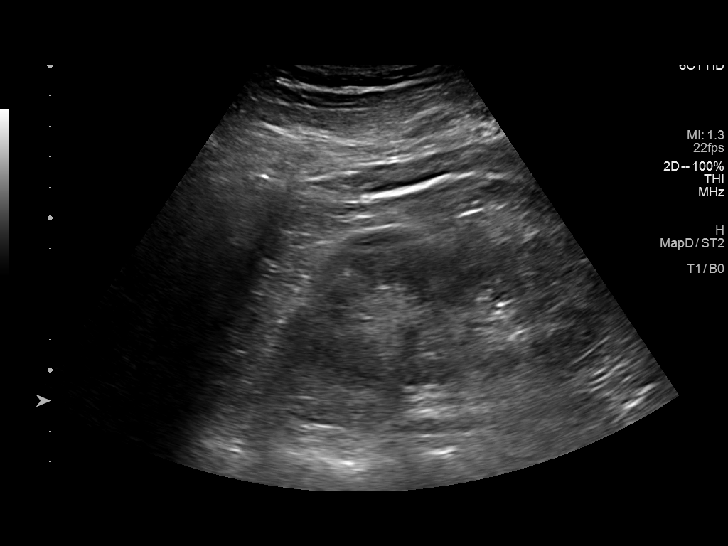
[im 12/71]
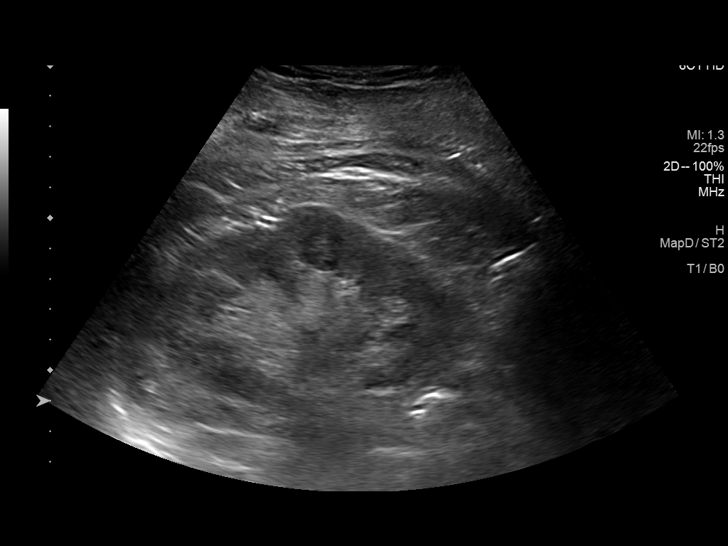
[im 18/71]
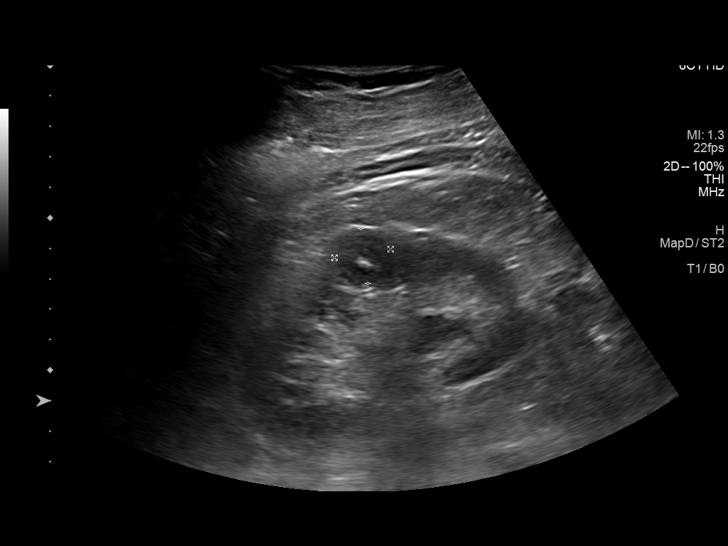
[im 24/71]
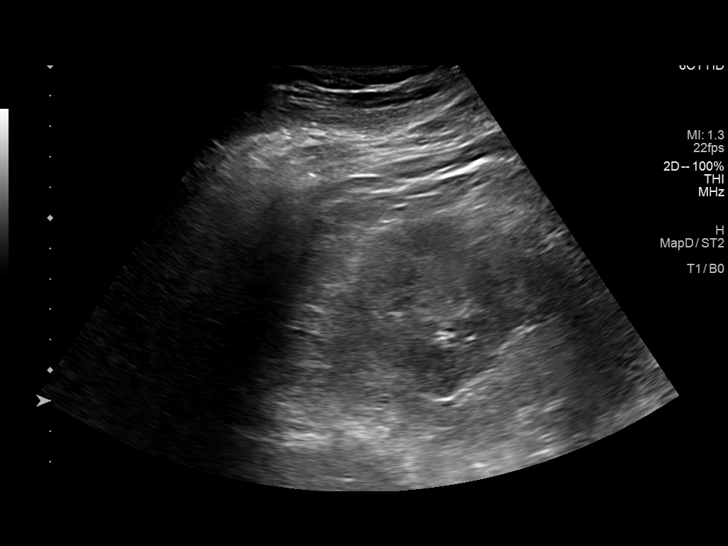
[im 30/71]
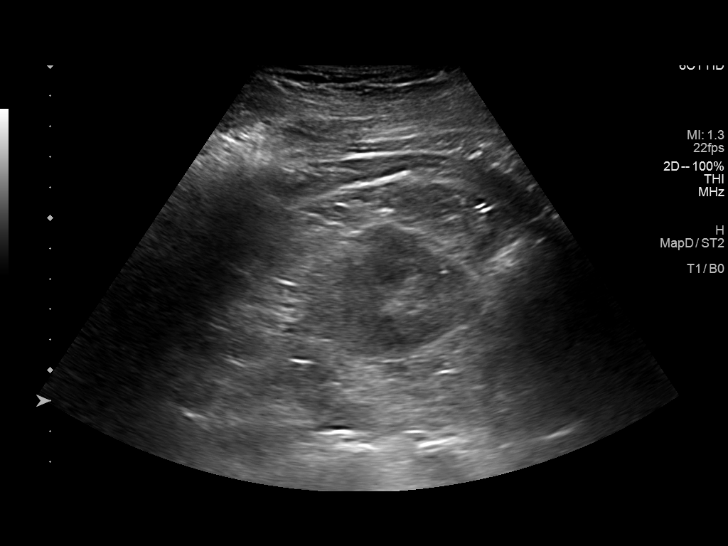
[im 36/71]
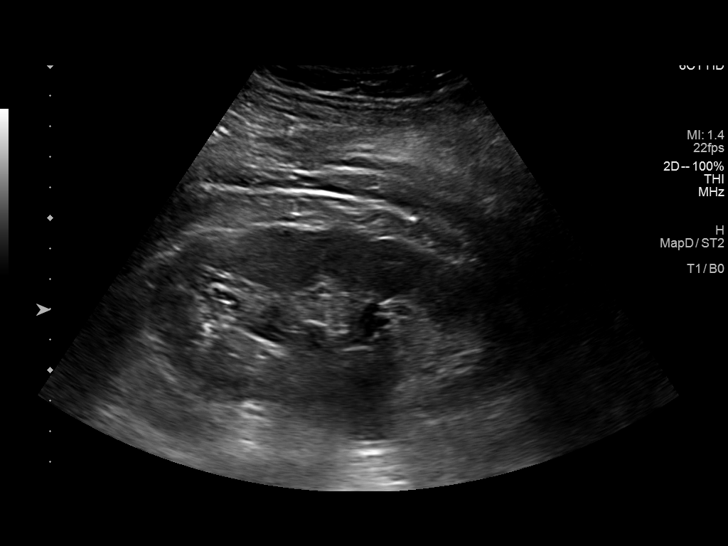
[im 41/71]
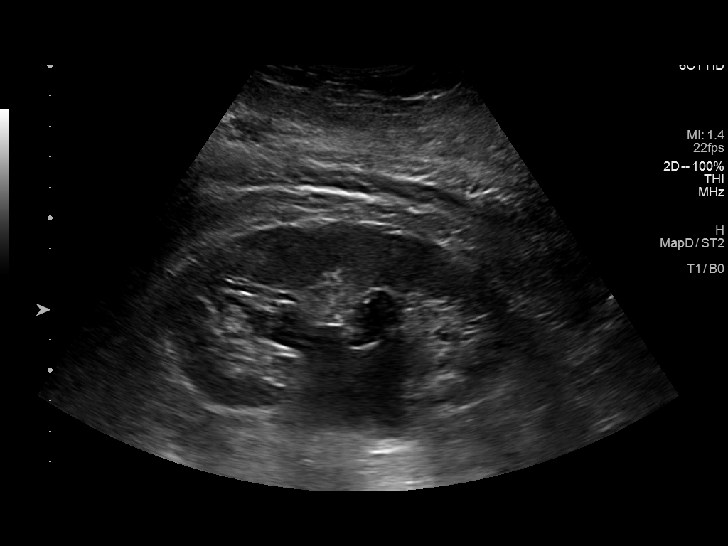
[im 47/71]
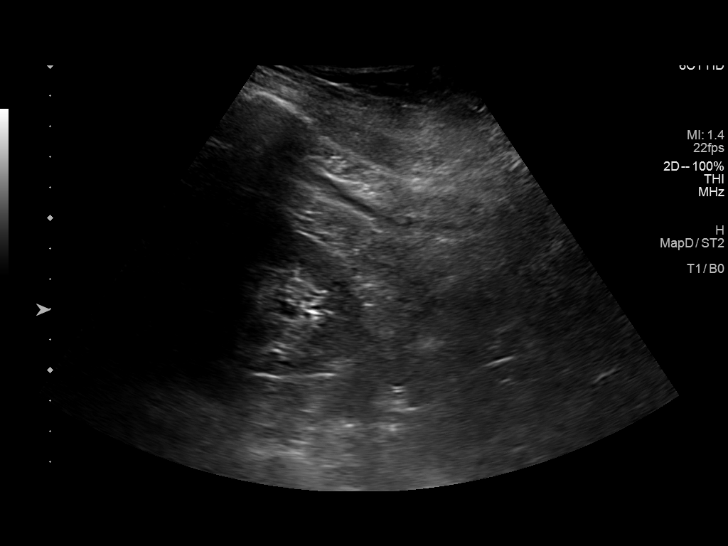
[im 53/71]
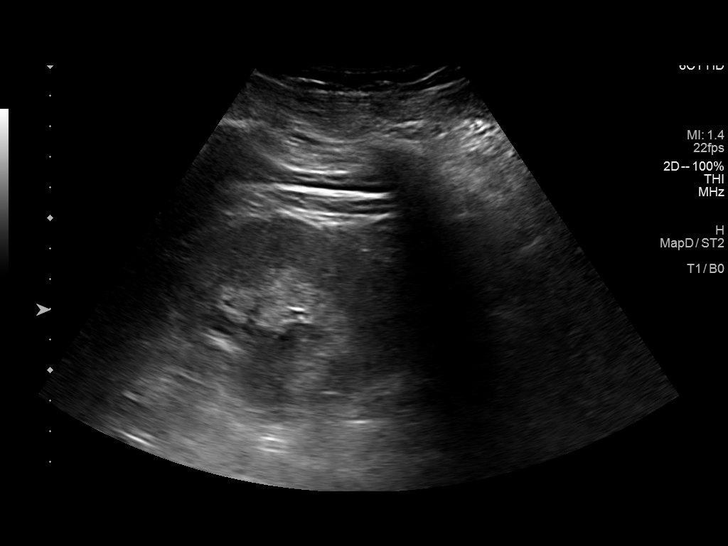
[im 59/71]
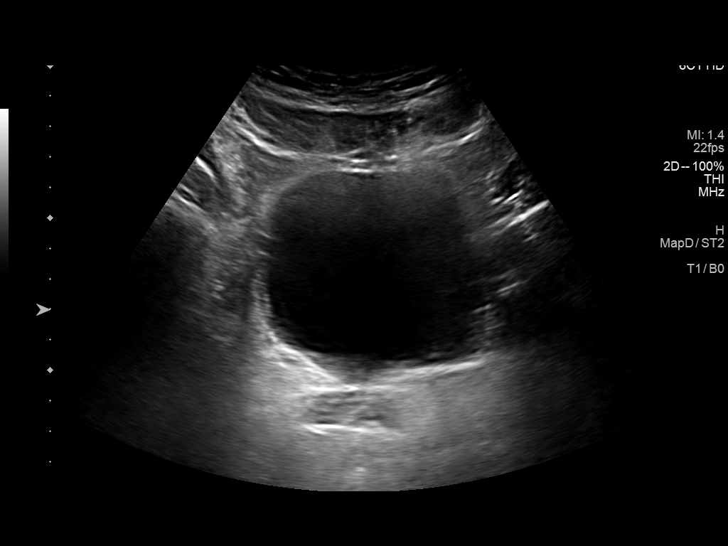
[im 65/71]
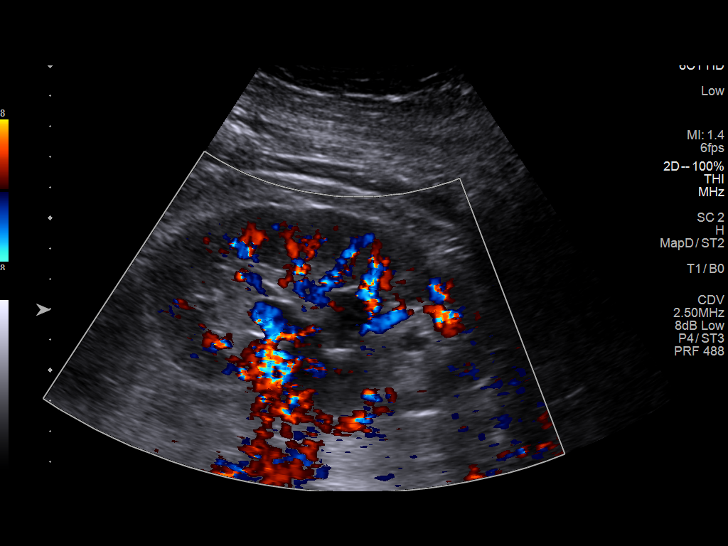
[im 71/71]
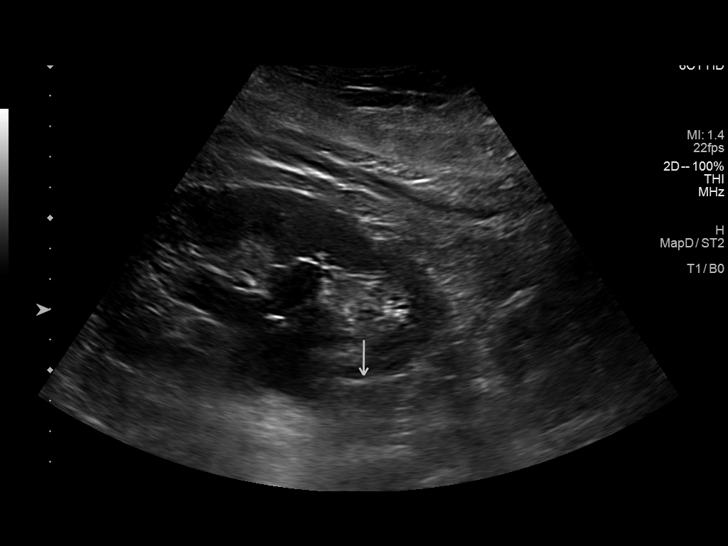

[13 of 25 positions shown; findings below may reference images not displayed]

FINDINGS: Right Kidney:

Renal measurements: 11.7 x 5.8 x 0.7 cm = volume: 311 mL. Normal
cortical thickness. Slightly increased cortical echogenicity. No
hydronephrosis or shadowing calcification. Complex area at mid
kidney, 1.8 x 1.9 x 2.7 cm containing a small central echogenic
focus. This was not seen on the previous ultrasound exam nor is
there evidence of a RIGHT renal mass on the current CT exam. No
additional masses.

Left Kidney:

Renal measurements: 12.7 x 6.2 x 8.7 cm = volume: 361 mL. Normal
cortical thickness. Increased cortical echogenicity. Mild LEFT
hydronephrosis., corresponding to appearance on recent CT. No
shadowing calculi or renal mass identified.

Bladder:

Appears normal for degree of bladder distention.

Other:

N/A
IMPRESSION: Persistent LEFT hydronephrosis, in combination with prior CT
favoring UPJ obstruction.

Question 2.7 x 1.8 x 1.9 cm diameter mass with central calcification
versus artifact at mid RIGHT kidney, not seen on prior ultrasound
nor on recent CT exam.

Recommend dedicated renal assessment by CT or MR to exclude RIGHT
renal mass.

## 2021-05-13 ENCOUNTER — Other Ambulatory Visit: Payer: Self-pay | Admitting: Neurology

## 2021-07-25 ENCOUNTER — Other Ambulatory Visit (HOSPITAL_COMMUNITY): Payer: Self-pay | Admitting: Urology

## 2021-07-25 ENCOUNTER — Other Ambulatory Visit: Payer: Self-pay | Admitting: Urology

## 2021-07-25 DIAGNOSIS — N1330 Unspecified hydronephrosis: Secondary | ICD-10-CM

## 2021-08-16 ENCOUNTER — Ambulatory Visit (HOSPITAL_COMMUNITY)
Admission: RE | Admit: 2021-08-16 | Discharge: 2021-08-16 | Disposition: A | Discharge status: Home / Self Care | Payer: Managed Care, Other (non HMO) | Source: Ambulatory Visit | Attending: Urology | Admitting: Urology

## 2021-08-16 ENCOUNTER — Other Ambulatory Visit: Payer: Self-pay

## 2021-08-16 DIAGNOSIS — N1330 Unspecified hydronephrosis: Secondary | ICD-10-CM

## 2021-08-16 IMAGING — NM NM RENAL IMAGING FLOW W/ PHARM
2 series · 12 of 12 positions shown · non-contrast
Comparison: Ultrasound kidneys 04/12/2021

CLINICAL DATA: Evaluate hydronephrosis.

EXAM:
NUCLEAR MEDICINE RENAL SCAN WITH DIURETIC ADMINISTRATION
TECHNIQUE: Radionuclide angiographic and sequential renal images were obtained
after intravenous injection of radiopharmaceutical. Imaging was
continued during slow intravenous injection of Lasix approximately
15 minutes after the start of the examination.
RADIOPHARMACEUTICALS:  5.19 mCi Yechnetium-FFm MAG3 IV

[Series 1: renal scan · 4.14mm/px · 6 of 90 frames shown (1 of 2)]
[frame 8/90]
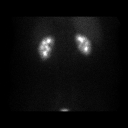
[frame 23/90]
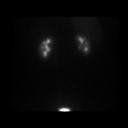
[frame 38/90]
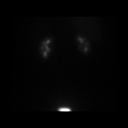
[frame 53/90]
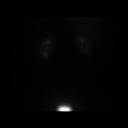
[frame 68/90]
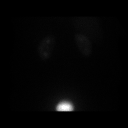
[frame 83/90]
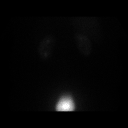

[Series 1: renal scan · 4.14mm/px · 6 of 40 frames shown (2 of 2)]
[frame 4/40]
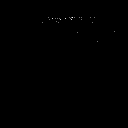
[frame 10/40  full-range]
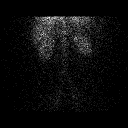
[frame 17/40  full-range]
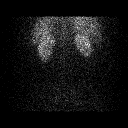
[frame 24/40  full-range]
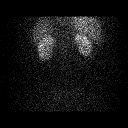
[frame 30/40  full-range]
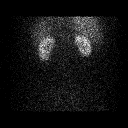
[frame 37/40  full-range]
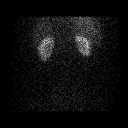

[12 of 12 positions shown; findings below may reference images not displayed]

FINDINGS: Flow: On the flow phase portion of the exam there is symmetric
perfusion to both kidneys.

Left renogram: Normal coronal uptake and excretion of the
radiopharmaceutical. Equivocal delayed clearance of the
radiopharmaceutical.

Right renogram: Right kidney is smaller than the left. There is
normal cortical uptake, excretion and clearance of the
radiopharmaceutical.

Differential:

Left kidney = 49 %

Right kidney = 51 %

T1/2 post Lasix :

Left kidney = 13.7 min

Right kidney = 9.7 min
IMPRESSION: 1. Split renal function is equal to 49% from the left kidney and 51%
from the right kidney.
2. Normally functioning right kidney.
3. Normal perfusion, cortical uptake and excretion of the
radiopharmaceutical by the left kidney. There is equivocal delayed
clearance of the radiopharmaceutical by the left kidney. None

## 2021-08-16 MED ORDER — FUROSEMIDE 10 MG/ML IJ SOLN
INTRAMUSCULAR | Status: AC
  Administered 2021-08-16: 60 mg via INTRAVENOUS
  Filled 2021-08-16: qty 8

## 2021-08-16 MED ORDER — TECHNETIUM TC 99M MERTIATIDE
5.1900 | Freq: Once | INTRAVENOUS | Status: AC
  Administered 2021-08-16: 5.19 via INTRAVENOUS

## 2021-11-06 ENCOUNTER — Other Ambulatory Visit: Payer: Self-pay | Admitting: Nephrology

## 2021-11-06 DIAGNOSIS — N1330 Unspecified hydronephrosis: Secondary | ICD-10-CM

## 2022-01-16 ENCOUNTER — Encounter: Payer: Self-pay | Admitting: Neurology

## 2022-01-16 ENCOUNTER — Other Ambulatory Visit: Payer: Self-pay

## 2022-01-16 ENCOUNTER — Ambulatory Visit: Payer: Managed Care, Other (non HMO) | Admitting: Neurology

## 2022-01-16 VITALS — BP 120/81 | HR 66 | Ht 71.0 in | Wt 274.0 lb

## 2022-01-16 DIAGNOSIS — G43009 Migraine without aura, not intractable, without status migrainosus: Secondary | ICD-10-CM

## 2022-01-16 MED ORDER — SUMATRIPTAN SUCCINATE 100 MG PO TABS: ORAL_TABLET | ORAL | 11 refills | Status: DC

## 2022-01-16 MED ORDER — TOPIRAMATE 100 MG PO TABS: 100.0000 mg | ORAL_TABLET | Freq: Two times a day (BID) | ORAL | 3 refills | Status: DC

## 2022-01-16 NOTE — Patient Instructions (Signed)
Return to clinic in 1 year.

## 2022-01-16 NOTE — Progress Notes (Signed)
? ? ?Follow-up Visit ? ? ?Date: 01/16/22 ? ? ?Patrick Christensen ?MRN: [id number] ?DOB: February 19, 1971 ? ? ?Interim History: ?Patrick Christensen is a 51 y.o. left-handed Caucasian male with GERD, hypertension, and migraines returning to the clinic for follow-up of chronic headaches.  The patient was accompanied to the clinic by self. ? ?He is here for one year visit.  Overall, headaches are well-controlled on topirmate 100mg  BID and has about 8 times per month, which is mostly responsive to imitrex.  He feels that some of his triggers is seasonal and neck-related.  No new neurological complaints.  ? ? ?Medications:  ?Current Outpatient Medications on File Prior to Visit  ?Medication Sig Dispense Refill  ? albuterol (VENTOLIN HFA) 108 (90 Base) MCG/ACT inhaler Inhale 2 puffs into the lungs every 6 (six) hours as needed.    ? amLODipine (NORVASC) 5 MG tablet Take 5 mg by mouth daily.     ? atorvastatin (LIPITOR) 10 MG tablet Take 1 tablet (10 mg total) by mouth daily. 90 tablet 1  ? metoprolol succinate (TOPROL-XL) 50 MG 24 hr tablet Take 1 tablet (50 mg total) by mouth daily. Take with or immediately following a meal. 90 tablet 2  ? nitroGLYCERIN (NITROSTAT) 0.4 MG SL tablet Place 1 tablet (0.4 mg total) under the tongue every 5 (five) minutes as needed for chest pain. 25 tablet 11  ? omeprazole (PRILOSEC) 40 MG capsule Take 40 mg by mouth daily.    ? SUMAtriptan (IMITREX) 100 MG tablet TAKE 1 TABLET BY MOUTH AT HEADACHE ONSET. IF NO IMPROVEMENT IN 2 HOURS, OK TO REPEAT. 12 tablet 5  ? topiramate (TOPAMAX) 100 MG tablet Take 1 tablet (100 mg total) by mouth 2 (two) times daily. 60 tablet 11  ? aspirin EC 81 MG tablet Take 81 mg by mouth daily. (Patient not taking: Reported on 01/12/2021)    ? cyclobenzaprine (FLEXERIL) 5 MG tablet Take 1 tablet (5 mg total) by mouth at bedtime as needed for muscle spasms. (Patient not taking: Reported on 01/12/2021) 30 tablet 2  ? phentermine (ADIPEX-P) 37.5 MG tablet Take 37.5 mg by mouth daily.  (Patient not taking: Reported on 01/16/2022)    ? ?No current facility-administered medications on file prior to visit.  ? ? ?Allergies: No Known Allergies ? ? ?Vital Signs:  ?BP 120/81   Pulse 66   Ht 5\' 11"  (1.803 m)   Wt 274 lb (124.3 kg)   SpO2 100%   BMI 38.22 kg/m?  ? ? ?Neurological Exam: ?MENTAL STATUS including orientation to time, place, person, recent and remote memory, attention span and concentration, language, and fund of knowledge is normal.  Speech is not dysarthric. ? ?CRANIAL NERVES:  No visual field defects.  Pupils equal round and reactive to light.  Normal conjugate, extra-ocular eye movements in all directions of gaze.  No ptosis.  Face is symmetric. Palate elevates symmetrically.  Tongue is midline. ? ?MOTOR:  Motor strength is 5/5 in all extremities ? ?COORDINATION/GAIT:    Gait narrow based and stable.  ? ?Data:n/a ? ?IMPRESSION/PLAN: ?1.  Chronic migraine without aura, stable, migraine frequency is 8 per month which can vary. ? - Previously tried:  Depakote, wellbutrin, amitriptyline, verapamil, venlafaxine, Aimovig ? - Continue topiramate 100mg  twice daily - refilled ? ?2.  Episodic migraine without aura ? - Previously tried:  Maxalt, Zomig ? - Continue imitrex 100mg  for severe migraine - refilled ? ?Return to clinic in 1 year ? ?Thank you for allowing me to  participate in patient's care.  If I can answer any additional questions, I would be pleased to do so.   ? ?Sincerely, ? ? ? ?Okema Rollinson K. Allena Katz, DO ? ? ?

## 2022-09-20 ENCOUNTER — Other Ambulatory Visit: Payer: Self-pay | Admitting: Neurology

## 2023-01-29 ENCOUNTER — Ambulatory Visit: Payer: BC Managed Care – PPO | Admitting: Neurology

## 2023-01-29 ENCOUNTER — Encounter: Payer: Self-pay | Admitting: Neurology

## 2023-01-29 VITALS — BP 139/94 | HR 78 | Ht 71.0 in | Wt 329.0 lb

## 2023-01-29 DIAGNOSIS — G43009 Migraine without aura, not intractable, without status migrainosus: Secondary | ICD-10-CM | POA: Diagnosis not present

## 2023-01-29 MED ORDER — SUMATRIPTAN SUCCINATE 100 MG PO TABS: ORAL_TABLET | ORAL | 11 refills | Status: DC

## 2023-01-29 MED ORDER — TOPIRAMATE 100 MG PO TABS: ORAL_TABLET | ORAL | 3 refills | Status: DC

## 2023-01-29 NOTE — Progress Notes (Signed)
    Follow-up Visit   Date: 01/29/23   Patrick Christensen MRN: [id number] DOB: [birth year]   Interim History: Patrick Christensen is a 52 y.o. left-handed Caucasian male with GERD, hypertension, and migraines returning to the clinic for follow-up of chronic headaches.  The patient was accompanied to the clinic by self.  IMPRESSION/PLAN: Chronic migraine without aura, worsening frequency of migraines to 8-10 times per month  - Previously tried:  Depakote, wellbutrin, amitriptyline, verapamil, venlafaxine, Aimovig Episodic migraine without aura, responsive to imitrex - Previously tried:  Maxalt, zomig - Continue imitrex 100mg  for severe headache   Return to clinic in 1 year  ------------------------------------------------------- UPDATE 01/29/2023:  He is here for one year visit.  He has worsening migraines over the past year, now occurring 8-10 times per month.  Previously, he was able to have imitrex prescription with left over pills each month, but more recently, he is using up the tablets each month.  He has gained 50lb over the past year.    Medications:  Current Outpatient Medications on File Prior to Visit  Medication Sig Dispense Refill   albuterol (VENTOLIN HFA) 108 (90 Base) MCG/ACT inhaler Inhale 2 puffs into the lungs every 6 (six) hours as needed.     amLODipine (NORVASC) 5 MG tablet Take 5 mg by mouth daily.      atorvastatin (LIPITOR) 10 MG tablet Take 1 tablet (10 mg total) by mouth daily. 90 tablet 1   metoprolol succinate (TOPROL-XL) 50 MG 24 hr tablet Take 1 tablet (50 mg total) by mouth daily. Take with or immediately following a meal. 90 tablet 2   nitroGLYCERIN (NITROSTAT) 0.4 MG SL tablet Place 1 tablet (0.4 mg total) under the tongue every 5 (five) minutes as needed for chest pain. 25 tablet 11   omeprazole (PRILOSEC) 40 MG capsule Take 40 mg by mouth daily.     SUMAtriptan (IMITREX) 100 MG tablet May repeat in 2 hours if headache persists or recurs. 12 tablet 11    topiramate (TOPAMAX) 100 MG tablet TAKE 1 TABLET BY MOUTH TWICE A DAY 180 tablet 1   No current facility-administered medications on file prior to visit.    Allergies: No Known Allergies   Vital Signs:  BP (!) 139/94   Pulse 78   Ht 5\' 11"  (1.803 m)   Wt (!) 329 lb (149.2 kg)   SpO2 98%   BMI 45.89 kg/m    Neurological Exam: MENTAL STATUS including orientation to time, place, person, recent and remote memory, attention span and concentration, language, and fund of knowledge is normal.  Speech is not dysarthric.  CRANIAL NERVES:  No visual field defects.  Pupils equal round and reactive to light.  Normal conjugate, extra-ocular eye movements in all directions of gaze.  No ptosis.  Face is symmetric. Palate elevates symmetrically.    MOTOR:  Motor strength is 5/5 in all extremities  COORDINATION/GAIT:    Gait narrow based and stable.   Data:n/a    Thank you for allowing me to participate in patient's care.  If I can answer any additional questions, I would be pleased to do so.    Sincerely,    Thelton Graca K. Posey Pronto, DO

## 2023-01-29 NOTE — Patient Instructions (Signed)
Increase topiramate to 100mg  in the morning and 150mg  at bedtime  I will see you back in 1 year

## 2023-07-19 ENCOUNTER — Other Ambulatory Visit: Payer: Self-pay | Admitting: Neurology

## 2024-02-11 ENCOUNTER — Ambulatory Visit: Payer: BC Managed Care – PPO | Admitting: Neurology

## 2024-02-11 ENCOUNTER — Other Ambulatory Visit (HOSPITAL_COMMUNITY): Payer: Self-pay

## 2024-02-11 ENCOUNTER — Telehealth: Payer: Self-pay | Admitting: Pharmacy Technician

## 2024-02-11 VITALS — BP 119/83 | Ht 71.0 in | Wt 288.0 lb

## 2024-02-11 DIAGNOSIS — G43009 Migraine without aura, not intractable, without status migrainosus: Secondary | ICD-10-CM | POA: Diagnosis not present

## 2024-02-11 MED ORDER — NURTEC 75 MG PO TBDP: ORAL_TABLET | ORAL | 0 refills | Status: AC

## 2024-02-11 MED ORDER — SUMATRIPTAN SUCCINATE 100 MG PO TABS: ORAL_TABLET | ORAL | 11 refills | Status: AC

## 2024-02-11 MED ORDER — TOPIRAMATE 100 MG PO TABS: ORAL_TABLET | ORAL | 3 refills | Status: AC

## 2024-02-11 MED ORDER — NURTEC 75 MG PO TBDP: 75.0000 mg | ORAL_TABLET | ORAL | 11 refills | Status: AC

## 2024-02-11 NOTE — Patient Instructions (Signed)
 Continue topiramate 150mg  twice daily Start Nurtec 75mg  every other day OK to use imitrex 100mg 

## 2024-02-11 NOTE — Telephone Encounter (Signed)
 Pharmacy Patient Advocate Encounter  Received notification from [facility] that Prior Authorization for NURTEC 75MG  has been APPROVED from 4.16.25 to 7.9.25. Ran test claim, Copay is $0. This test claim was processed through [facility]- copay amounts may vary at other pharmacies due to pharmacy/plan contracts, or as the patient moves through the different stages of their insurance plan.   PA #/Case ID/Reference #: [id number]

## 2024-02-11 NOTE — Telephone Encounter (Signed)
 Pharmacy Patient Advocate Encounter   Received notification from CoverMyMeds that prior authorization for NURTEC 75MG  is required/requested.   Insurance verification completed.   The patient is insured through [facility] .   Per test claim: PA required; PA submitted to above mentioned insurance via CoverMyMeds Key/confirmation #/EOC [facility] Status is pending

## 2024-02-11 NOTE — Progress Notes (Signed)
 Follow-up Visit   Date: 02/11/24   Patrick Christensen MRN: [id number] DOB: 1971-04-08   Interim History: Patrick Christensen is a 53 y.o. left-handed Caucasian male with GERD, hypertension, and migraines returning to the clinic for follow-up of chronic headaches.  The patient was accompanied to the clinic by self.  IMPRESSION/PLAN: Chronic migraine without aura, frequency of migraines to 8-10 times per month  - Previously tried:  Depakote, wellbutrin, amitriptyline, verapamil, venlafaxine, Aimovig  - Continue topiramate 150mg  twice daily  - Start nurtec 75mg  every other day  Episodic migraine without aura, responsive to imitrex - Previously tried:  Maxalt, zomig - Continue imitrex 100mg  for severe headache   Return to clinic in 1 year  ------------------------------------------------------- UPDATE 01/29/2023:  He is here for one year visit.  He has worsening migraines over the past year, now occurring 8-10 times per month.  Previously, he was able to have imitrex prescription with left over pills each month, but more recently, he is using up the tablets each month.  He has gained 50lb over the past year.    UPDATE 02/11/2024:  He has been having increased pain over the top of the head which does not respond to imitrex. This headache usually occurs upon awakening.   He gets this type of headache about once per month.  Sometimes, it will evolve into a migraine.  He continues to get migraines about 10 times per month which does respond to imitrex.  He started [facility] in the fall and is unsure if headache intensity could be related to this.   Medications:  Current Outpatient Medications on File Prior to Visit  Medication Sig Dispense Refill   albuterol (VENTOLIN HFA) 108 (90 Base) MCG/ACT inhaler Inhale 2 puffs into the lungs every 6 (six) hours as needed.     amLODipine (NORVASC) 5 MG tablet Take 5 mg by mouth daily.      atorvastatin (LIPITOR) 10 MG tablet Take 1 tablet (10 mg total) by  mouth daily. 90 tablet 1   metoprolol succinate (TOPROL-XL) 50 MG 24 hr tablet Take 1 tablet (50 mg total) by mouth daily. Take with or immediately following a meal. 90 tablet 2   omeprazole (PRILOSEC) 40 MG capsule Take 40 mg by mouth daily.     Semaglutide-Weight Management (WEGOVY) 1 MG/0.5ML SOAJ Inject 1 mg into the skin.     SUMAtriptan (IMITREX) 100 MG tablet May repeat in 2 hours if headache persists or recurs. 12 tablet 11   topiramate (TOPAMAX) 100 MG tablet TAKE 1 TABLET BY MOUTH TWICE A DAY 180 tablet 1   No current facility-administered medications on file prior to visit.    Allergies: No Known Allergies   Vital Signs:  BP 119/83   Ht 5\' 11"  (1.803 m)   Wt 288 lb (130.6 kg)   SpO2 98%   BMI 40.17 kg/m    Neurological Exam: MENTAL STATUS including orientation to time, place, person, recent and remote memory, attention span and concentration, language, and fund of knowledge is normal.  Speech is not dysarthric.  CRANIAL NERVES:  Pupils equal round and reactive to light.  Normal conjugate, extra-ocular eye movements in all directions of gaze.  No ptosis.  Face is symmetric. Palate elevates symmetrically.    MOTOR:  Motor strength is 5/5 in all extremities  COORDINATION/GAIT:    Gait narrow based and stable.   Data:n/a    Thank you for allowing me to participate in patient's care.  If I can  answer any additional questions, I would be pleased to do so.    Sincerely,    Eirene Rather K. Lydia Sams, DO

## 2024-04-11 ENCOUNTER — Other Ambulatory Visit: Payer: Self-pay | Admitting: Neurology

## 2024-05-24 ENCOUNTER — Telehealth: Payer: Self-pay | Admitting: Neurology

## 2024-05-24 NOTE — Telephone Encounter (Signed)
 [facility] called and left a message with the after hours service on 05/21/24 about prior auth.

## 2024-05-25 ENCOUNTER — Telehealth: Payer: Self-pay

## 2024-05-25 NOTE — Telephone Encounter (Signed)
*  [facility]  Pharmacy Patient Advocate Encounter   Received notification from Pt Calls Messages that prior authorization for Nurtec 75MG  dispersible tablets  is required/requested.   Insurance verification completed.   The patient is insured through [facility] .   Per test claim: PA required; PA started via CoverMyMeds. KEY [id number] . Please see clinical question(s) below that I am not finding the answer to in their chart and advise.   Has the patient experienced a decreased number of migraine days or frequency?

## 2024-06-02 ENCOUNTER — Other Ambulatory Visit (HOSPITAL_COMMUNITY): Payer: Self-pay

## 2024-06-02 ENCOUNTER — Telehealth: Payer: Self-pay | Admitting: Pharmacy Technician

## 2024-06-02 NOTE — Telephone Encounter (Signed)
 Pharmacy Patient Advocate Encounter  Received notification from [facility] that Prior Authorization for NURTEC 75MG  has been APPROVED from 8.6.25 to 8.6.26. Ran test claim, Copay is $0. This test claim was processed through [facility]- copay amounts may vary at other pharmacies due to pharmacy/plan contracts, or as the patient moves through the different stages of their insurance plan.   PA #/Case ID/Reference #: [id number]

## 2024-06-02 NOTE — Telephone Encounter (Signed)
 Pharmacy Patient Advocate Encounter   Received notification from CoverMyMeds that prior authorization for NURTEC 75MG  is required/requested.   Insurance verification completed.   The patient is insured through [facility] .   Per test claim: PA required; PA submitted to above mentioned insurance via CoverMyMeds Key/confirmation #/EOC [id number] Status is pending

## 2024-06-04 ENCOUNTER — Telehealth: Payer: Self-pay | Admitting: Neurology

## 2024-06-04 NOTE — Telephone Encounter (Signed)
 Mia with [facility] stated that pt is wanting to get his medicine called Nurtec refilled, but Mia stated that it was an order in from our office to cancel the order. Pt wants to know why the order has been canceled. Pt stated that he needs the Nurtec. Thanks

## 2024-06-04 NOTE — Telephone Encounter (Signed)
 [id number] [place], [place] - [address] [place] [address] Phone: [phone]  Fax: [phone]   Rimegepant Sulfate (NURTEC) 75 MG TBDP 16 tablet 11 02/11/2024 --      Sent to pharmacy as: Rimegepant Sulfate (NURTEC) 75 MG 02/11/24 16 tablets sent with 11 refills.   02/11/2024       Called and spoke to pharmacy and they stated patient was trying to fill his rx to early. Insurance only allows refills every 32 days of 16 tablets. Patient may fill today.   Called patient and informed him of above. Patient verbalized understanding.

## 2024-09-21 ENCOUNTER — Other Ambulatory Visit: Payer: Self-pay | Admitting: Neurology

## 2025-02-15 ENCOUNTER — Ambulatory Visit: Admitting: Neurology
